# Patient Record
Sex: Female | Born: 1975 | Marital: Married | Smoking: Never smoker
Health system: Southern US, Community
[De-identification: ages and names within clinical notes are randomized; demographics above are authoritative.]

## PROBLEM LIST (undated history)

## (undated) DIAGNOSIS — M069 Rheumatoid arthritis, unspecified: Secondary | ICD-10-CM

## (undated) HISTORY — DX: Rheumatoid arthritis, unspecified: M06.9

## (undated) HISTORY — PX: TOTAL SHOULDER ARTHROPLASTY: SHX126

---

## 2013-10-28 ENCOUNTER — Ambulatory Visit
Admission: RE | Admit: 2013-10-28 | Discharge: 2013-10-28 | Disposition: A | Discharge status: Home / Self Care | Payer: BC Managed Care – PPO | Source: Ambulatory Visit | Attending: Family Medicine | Admitting: Family Medicine

## 2013-10-28 ENCOUNTER — Other Ambulatory Visit: Payer: Self-pay | Admitting: Family Medicine

## 2013-10-28 DIAGNOSIS — R109 Unspecified abdominal pain: Secondary | ICD-10-CM

## 2013-10-28 MED ORDER — IOHEXOL 300 MG/ML  SOLN
30.0000 mL | Freq: Once | INTRAMUSCULAR | Status: AC | PRN
  Administered 2013-10-28: 30 mL via ORAL

## 2013-10-28 MED ORDER — IOHEXOL 300 MG/ML  SOLN
100.0000 mL | Freq: Once | INTRAMUSCULAR | Status: AC | PRN
  Administered 2013-10-28: 100 mL via INTRAVENOUS

## 2014-01-02 ENCOUNTER — Other Ambulatory Visit: Payer: Self-pay

## 2014-01-02 ENCOUNTER — Other Ambulatory Visit (HOSPITAL_COMMUNITY)
Admission: RE | Admit: 2014-01-02 | Discharge: 2014-01-02 | Disposition: A | Discharge status: Home / Self Care | Payer: BC Managed Care – PPO | Source: Ambulatory Visit | Attending: Family Medicine | Admitting: Family Medicine

## 2014-01-02 ENCOUNTER — Other Ambulatory Visit: Payer: Self-pay | Admitting: Family Medicine

## 2014-01-02 DIAGNOSIS — Z1231 Encounter for screening mammogram for malignant neoplasm of breast: Secondary | ICD-10-CM

## 2014-01-02 DIAGNOSIS — Z01411 Encounter for gynecological examination (general) (routine) with abnormal findings: Secondary | ICD-10-CM | POA: Insufficient documentation

## 2014-01-04 LAB — CYTOLOGY - PAP

## 2014-06-30 ENCOUNTER — Ambulatory Visit
Admission: RE | Admit: 2014-06-30 | Discharge: 2014-06-30 | Disposition: A | Discharge status: Home / Self Care | Payer: BC Managed Care – PPO | Source: Ambulatory Visit

## 2014-06-30 DIAGNOSIS — Z1231 Encounter for screening mammogram for malignant neoplasm of breast: Secondary | ICD-10-CM

## 2014-12-27 ENCOUNTER — Other Ambulatory Visit: Payer: Self-pay | Admitting: Family Medicine

## 2014-12-27 ENCOUNTER — Other Ambulatory Visit (HOSPITAL_COMMUNITY)
Admission: RE | Admit: 2014-12-27 | Discharge: 2014-12-27 | Disposition: A | Discharge status: Home / Self Care | Payer: BC Managed Care – PPO | Source: Ambulatory Visit | Attending: Family Medicine | Admitting: Family Medicine

## 2014-12-27 DIAGNOSIS — Z124 Encounter for screening for malignant neoplasm of cervix: Secondary | ICD-10-CM | POA: Diagnosis not present

## 2015-01-01 LAB — CYTOLOGY - PAP

## 2015-07-17 ENCOUNTER — Other Ambulatory Visit: Payer: Self-pay | Admitting: Family Medicine

## 2015-07-17 DIAGNOSIS — Z1231 Encounter for screening mammogram for malignant neoplasm of breast: Secondary | ICD-10-CM

## 2015-07-30 ENCOUNTER — Ambulatory Visit
Admission: RE | Admit: 2015-07-30 | Discharge: 2015-07-30 | Disposition: A | Discharge status: Home / Self Care | Payer: BC Managed Care – PPO | Source: Ambulatory Visit | Attending: Family Medicine | Admitting: Family Medicine

## 2015-07-30 DIAGNOSIS — Z1231 Encounter for screening mammogram for malignant neoplasm of breast: Secondary | ICD-10-CM

## 2016-01-04 ENCOUNTER — Other Ambulatory Visit: Payer: Self-pay | Admitting: Family Medicine

## 2016-01-04 ENCOUNTER — Other Ambulatory Visit (HOSPITAL_COMMUNITY)
Admission: RE | Admit: 2016-01-04 | Discharge: 2016-01-04 | Disposition: A | Discharge status: Home / Self Care | Payer: BC Managed Care – PPO | Source: Ambulatory Visit | Attending: Family Medicine | Admitting: Family Medicine

## 2016-01-04 DIAGNOSIS — Z124 Encounter for screening for malignant neoplasm of cervix: Secondary | ICD-10-CM | POA: Diagnosis present

## 2016-01-04 DIAGNOSIS — Z1151 Encounter for screening for human papillomavirus (HPV): Secondary | ICD-10-CM | POA: Diagnosis present

## 2016-01-07 LAB — CYTOLOGY - PAP
DIAGNOSIS: NEGATIVE
HPV: NOT DETECTED

## 2016-04-23 ENCOUNTER — Ambulatory Visit (INDEPENDENT_AMBULATORY_CARE_PROVIDER_SITE_OTHER): Payer: BC Managed Care – PPO | Admitting: Podiatry

## 2016-04-23 ENCOUNTER — Ambulatory Visit (INDEPENDENT_AMBULATORY_CARE_PROVIDER_SITE_OTHER): Payer: BC Managed Care – PPO

## 2016-04-23 ENCOUNTER — Encounter: Payer: Self-pay | Admitting: Podiatry

## 2016-04-23 VITALS — BP 107/74 | HR 73 | Resp 14 | Ht 60.0 in | Wt 128.0 lb

## 2016-04-23 DIAGNOSIS — G5791 Unspecified mononeuropathy of right lower limb: Secondary | ICD-10-CM

## 2016-04-23 DIAGNOSIS — M898X7 Other specified disorders of bone, ankle and foot: Secondary | ICD-10-CM

## 2016-04-23 DIAGNOSIS — D3610 Benign neoplasm of peripheral nerves and autonomic nervous system, unspecified: Secondary | ICD-10-CM

## 2016-04-23 DIAGNOSIS — G5761 Lesion of plantar nerve, right lower limb: Secondary | ICD-10-CM | POA: Diagnosis not present

## 2016-04-30 MED ORDER — BETAMETHASONE SOD PHOS & ACET 6 (3-3) MG/ML IJ SUSP: 3.0000 mg | Freq: Once | INTRAMUSCULAR | Status: DC

## 2016-04-30 NOTE — Progress Notes (Signed)
   Subjective:  41 year old female presents to the office today for evaluation of right foot pain between digits 3-4. Patient states that she saw Dr. Gershon Mussel over the past year and received multiple injections. She states that the pain has gotten worse. Injections did not help. Patient was frustrated that she did not receive a full explanation of her diagnosis and symptoms. Patient also complains of pain to the pad of the right big toe.    Objective/Physical Exam General: The patient is alert and oriented x3 in no acute distress.  Dermatology: Skin is warm, dry and supple bilateral lower extremities. Negative for open lesions or macerations.  Vascular: Palpable pedal pulses bilaterally. No edema or erythema noted. Capillary refill within normal limits.  Neurological: Epicritic and protective threshold grossly intact bilaterally.   Musculoskeletal Exam: Positive Biagio Borg sign noted to the right foot with compression between digits 3 and 4. There is a palpable click also noted. This elicits the patient's symptoms consistent with a Morton's neuroma.  Radiographic Exam:  Normal osseous mineralization. Joint spaces preserved. No fracture/dislocation/boney destruction.  There is a large prominent exostosis noted to the right great toe on radiographic exam.  Assessment: #1 symptomatic exostosis right great toe #2 Morton's neuroma right foot   Plan of Care:  #1 Patient was evaluated. #2 injection of 0.5 mL Celestone Soluspan injected into the Morton's neuroma right foot #3 today patient was scanned for custom molded orthotics #4 prescription for anti-inflammatory pain cream dispensed through [facility] #5 return to clinic in 4 weeks   Edrick Kins, DPM [facility]  Dr. Edrick Kins, [address]                                        [place], [place] [address]                Office [phone]  Fax [phone]

## 2016-05-07 ENCOUNTER — Other Ambulatory Visit: Payer: BC Managed Care – PPO

## 2016-05-21 ENCOUNTER — Ambulatory Visit: Payer: BC Managed Care – PPO | Admitting: Podiatry

## 2016-06-04 ENCOUNTER — Encounter: Payer: Self-pay | Admitting: Podiatry

## 2016-06-04 ENCOUNTER — Ambulatory Visit (INDEPENDENT_AMBULATORY_CARE_PROVIDER_SITE_OTHER): Payer: Self-pay | Admitting: Podiatry

## 2016-06-04 DIAGNOSIS — G5761 Lesion of plantar nerve, right lower limb: Secondary | ICD-10-CM

## 2016-06-05 NOTE — Progress Notes (Signed)
   HPI:  Patient presents today for follow-up treatment and evaluation of a Morton's neuroma to the right foot as well as exostosis of the right great toe. Patient states that the custom molded orthotics alleviate her pain entirely to the right foot however since she gets out of her orthotics she spends a significant pain and tenderness relating to the Morton's neuroma. Multiple injections in the past have not alleviated any symptoms the patient Patient denies significant irritation or symptoms relating to the right great toe exostosis.    Physical Exam: General: The patient is alert and oriented x3 in no acute distress.  Dermatology: Skin is warm, dry and supple bilateral lower extremities. Negative for open lesions or macerations.  Vascular: Palpable pedal pulses bilaterally. No edema or erythema noted. Capillary refill within normal limits.  Neurological: Epicritic and protective threshold grossly intact bilaterally.   Musculoskeletal Exam: Positive Biagio Borg sign noted to the right foot with compression between digits 3 and 4. There is a palpable click also noted. This elicits the patient's symptoms consistent with a Morton's neuroma.  Assessment: 1. Morton's neuroma right foot 2. Symptomatic exostosis right great toe   Plan of Care:  1. Patient was evaluated. 2. Today we discussed the possible surgical management of Morton's neuroma. Patient is going to consider possible surgical excision of the neuroma. Recommend Vionic sandals during the summer to support the arches. 3. Return to clinic when necessary or 1 week postop   Edrick Kins, DPM [facility]  Dr. Edrick Kins, [address]                                        [place], [place] [address]                Office [phone]  Fax [phone]

## 2016-06-09 MED ORDER — NONFORMULARY OR COMPOUNDED ITEM: 0 refills | Status: DC

## 2016-06-09 NOTE — Addendum Note (Signed)
Addended by: Tomi Bamberger on: 06/09/2016 04:55 PM   Modules accepted: Orders

## 2016-08-04 ENCOUNTER — Other Ambulatory Visit: Payer: Self-pay | Admitting: Family Medicine

## 2016-08-04 DIAGNOSIS — Z1231 Encounter for screening mammogram for malignant neoplasm of breast: Secondary | ICD-10-CM

## 2016-08-07 ENCOUNTER — Ambulatory Visit
Admission: RE | Admit: 2016-08-07 | Discharge: 2016-08-07 | Disposition: A | Discharge status: Home / Self Care | Payer: BC Managed Care – PPO | Source: Ambulatory Visit | Attending: Family Medicine | Admitting: Family Medicine

## 2016-08-07 DIAGNOSIS — Z1231 Encounter for screening mammogram for malignant neoplasm of breast: Secondary | ICD-10-CM

## 2016-10-15 ENCOUNTER — Ambulatory Visit (INDEPENDENT_AMBULATORY_CARE_PROVIDER_SITE_OTHER): Payer: BC Managed Care – PPO | Admitting: Podiatry

## 2016-10-15 ENCOUNTER — Encounter: Payer: Self-pay | Admitting: Podiatry

## 2016-10-15 DIAGNOSIS — M722 Plantar fascial fibromatosis: Secondary | ICD-10-CM

## 2016-10-15 DIAGNOSIS — M7751 Other enthesopathy of right foot: Secondary | ICD-10-CM

## 2016-10-15 MED ORDER — BETAMETHASONE SOD PHOS & ACET 6 (3-3) MG/ML IJ SUSP: 3.0000 mg | Freq: Once | INTRAMUSCULAR | Status: DC

## 2016-10-15 MED ORDER — IBUPROFEN-FAMOTIDINE 800-26.6 MG PO TABS: 1.0000 | ORAL_TABLET | Freq: Three times a day (TID) | ORAL | 1 refills | Status: DC

## 2016-10-17 NOTE — Progress Notes (Signed)
   Subjective: Patient presents today for follow up evaluation of pain and tenderness in the feet bilaterally. She states the pain has returned and is the same as it was prior to her previous visit. Patient presents today for further treatment and evaluation.  No past medical history on file.  Objective: Physical Exam General: The patient is alert and oriented x3 in no acute distress.  Dermatology: Skin is warm, dry and supple bilateral lower extremities. Negative for open lesions or macerations bilateral.   Vascular: Dorsalis Pedis and Posterior Tibial pulses palpable bilateral.  Capillary fill time is immediate to all digits.  Neurological: Epicritic and protective threshold intact bilateral.   Musculoskeletal: Tenderness to palpation at the medial calcaneal tubercale and through the insertion of the plantar fascia of the bilateral feet. Pain on palpation to the right 5th MPJ. All other joints range of motion within normal limits bilateral. Strength 5/5 in all groups bilateral.    Assessment: 1. plantar fasciitis bilateral feet 2. Capsulitis right 5th MPJ  Plan of Care:  1. Patient evaluated.   2. Injection of 0.5cc Celestone soluspan injected into the right 5th MPJ.  3. Rx for Medrol Dose Pak given to patient (handwritten rx) 4. Rx for Duexis given to patient.  5. Return to clinic when necessary.    Edrick Kins, DPM [facility]  Dr. Edrick Kins, DPM    [address], [place] [address]                Office [phone]  Fax [phone]

## 2016-12-24 ENCOUNTER — Other Ambulatory Visit: Payer: Self-pay | Admitting: Gastroenterology

## 2016-12-24 DIAGNOSIS — R1011 Right upper quadrant pain: Secondary | ICD-10-CM

## 2016-12-24 DIAGNOSIS — R1013 Epigastric pain: Secondary | ICD-10-CM

## 2017-01-02 ENCOUNTER — Ambulatory Visit
Admission: RE | Admit: 2017-01-02 | Discharge: 2017-01-02 | Disposition: A | Discharge status: Home / Self Care | Payer: BC Managed Care – PPO | Source: Ambulatory Visit | Attending: Gastroenterology | Admitting: Gastroenterology

## 2017-01-02 DIAGNOSIS — R1011 Right upper quadrant pain: Secondary | ICD-10-CM

## 2017-01-02 DIAGNOSIS — R1013 Epigastric pain: Secondary | ICD-10-CM

## 2017-04-14 ENCOUNTER — Other Ambulatory Visit: Payer: Self-pay | Admitting: Family Medicine

## 2017-04-14 ENCOUNTER — Other Ambulatory Visit (HOSPITAL_COMMUNITY)
Admission: RE | Admit: 2017-04-14 | Discharge: 2017-04-14 | Disposition: A | Discharge status: Home / Self Care | Payer: BC Managed Care – PPO | Source: Ambulatory Visit | Attending: Family Medicine | Admitting: Family Medicine

## 2017-04-14 DIAGNOSIS — Z124 Encounter for screening for malignant neoplasm of cervix: Secondary | ICD-10-CM | POA: Diagnosis not present

## 2017-04-17 LAB — CYTOLOGY - PAP: Diagnosis: NEGATIVE

## 2017-07-24 ENCOUNTER — Other Ambulatory Visit: Payer: Self-pay | Admitting: Family Medicine

## 2017-07-24 DIAGNOSIS — Z1231 Encounter for screening mammogram for malignant neoplasm of breast: Secondary | ICD-10-CM

## 2017-08-12 ENCOUNTER — Other Ambulatory Visit (HOSPITAL_COMMUNITY): Payer: Self-pay | Admitting: Gastroenterology

## 2017-08-12 DIAGNOSIS — R1011 Right upper quadrant pain: Secondary | ICD-10-CM

## 2017-08-13 ENCOUNTER — Encounter (HOSPITAL_COMMUNITY)
Admission: RE | Admit: 2017-08-13 | Discharge: 2017-08-13 | Disposition: A | Discharge status: Home / Self Care | Payer: BC Managed Care – PPO | Source: Ambulatory Visit | Attending: Gastroenterology | Admitting: Gastroenterology

## 2017-08-13 DIAGNOSIS — R1011 Right upper quadrant pain: Secondary | ICD-10-CM | POA: Insufficient documentation

## 2017-08-13 MED ORDER — TECHNETIUM TC 99M MEBROFENIN IV KIT
5.3900 | PACK | Freq: Once | INTRAVENOUS | Status: AC | PRN
  Administered 2017-08-13: 5.39 via INTRAVENOUS

## 2017-08-20 ENCOUNTER — Ambulatory Visit: Payer: BC Managed Care – PPO

## 2017-09-14 ENCOUNTER — Ambulatory Visit
Admission: RE | Admit: 2017-09-14 | Discharge: 2017-09-14 | Disposition: A | Discharge status: Home / Self Care | Payer: BC Managed Care – PPO | Source: Ambulatory Visit | Attending: Family Medicine | Admitting: Family Medicine

## 2017-09-14 DIAGNOSIS — Z1231 Encounter for screening mammogram for malignant neoplasm of breast: Secondary | ICD-10-CM

## 2018-05-20 ENCOUNTER — Ambulatory Visit: Payer: BC Managed Care – PPO | Admitting: Rheumatology

## 2018-06-15 NOTE — Progress Notes (Signed)
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   History of Present Illness: Kristi Aguirre is a 43 y.o. female seen in consultation per request of Dr. Drema Dallas.  According to patient in 2017 she started having left shoulder joint pain at that time she was working out on regular basis.  She was seen by Dr. Tamera Punt and had surgery on her left shoulder joint.  She states she had no relief from that surgery.  In May 2019 she started experiencing increased joint pain and swelling.  She states she was seen by Dr. Drema Dallas who did lab work which was positive for rheumatoid factor.  She was referred to [facility] rheumatology where she was evaluated in July 2019.  She states she was placed on Plaquenil and meloxicam.  She did not notice much improvement and continued to have joint swelling.  They applied for Enbrel but it was declined by patient assistance and the co-pay was too high.  She did not want to take methotrexate due to hair loss.  She was already experiencing hair loss from Plaquenil.  She states she continues to have pain and discomfort in multiple joints.  She has swelling in her bilateral hands and discomfort.  She also has discomfort in her wrist joints left shoulder and right foot.  She continues to have some neck is stiffness.  She has been also experiencing GI discomfort after eating.  She has intermittent diarrhea.  She has history of IBS.  Activities of Daily Living:  Patient reports morning stiffness for 2 hours.   Patient Reports nocturnal pain.  Difficulty dressing/grooming: Reports Difficulty climbing stairs: Denies Difficulty getting out of chair: Denies Difficulty using hands for taps, buttons, cutlery, and/or writing: Denies  Review of Systems  Constitutional:  Positive for fatigue. Negative for night sweats, weight gain and weight loss.  HENT: Negative for mouth sores, trouble swallowing, trouble swallowing, mouth dryness and nose dryness.   Eyes: Negative for pain, redness, visual disturbance and dryness.  Respiratory: Negative for cough, shortness of breath and difficulty breathing.   Cardiovascular: Positive for swelling in legs/feet. Negative for chest pain, palpitations, hypertension and irregular heartbeat.  Gastrointestinal: Positive for diarrhea. Negative for blood in stool and constipation.  Endocrine: Positive for cold intolerance. Negative for increased urination.  Genitourinary: Negative for difficulty urinating and vaginal dryness.  Musculoskeletal: Positive for arthralgias, joint pain, joint swelling and morning stiffness. Negative for myalgias, muscle weakness, muscle tenderness and myalgias.  Skin: Positive for hair loss. Negative for color change, rash, skin tightness, ulcers and sensitivity to sunlight.  Allergic/Immunologic: Negative for susceptible to infections.  Neurological: Negative for dizziness, numbness, memory loss, night sweats and weakness.  Hematological: Negative for bruising/bleeding tendency and swollen glands.  Psychiatric/Behavioral: Positive for sleep disturbance. Negative for depressed mood. The patient is not nervous/anxious.     PMFS History:  There are no active problems to display for this patient.   History reviewed. No pertinent past medical history.  Family History  Problem Relation Age of Onset  . Thyroid disease Sister    Past Surgical History:  Procedure Laterality Date  . TOTAL SHOULDER ARTHROPLASTY     Social  History   Social History Narrative  . Not on file    There is no immunization history on file for this patient.   Objective: Vital Signs: BP 104/67 (BP Location: Right Arm, Patient Position: Sitting, Cuff Size: Normal)   Pulse 66   Resp 14   Ht 5' 1.75" (1.568 m)   Wt 138 lb  12.8 oz (63 kg)   LMP 06/03/2018   BMI 25.59 kg/m    Physical Exam Vitals signs and nursing note reviewed.  Constitutional:      Appearance: She is well-developed.  HENT:     Head: Normocephalic and atraumatic.  Eyes:     Conjunctiva/sclera: Conjunctivae normal.  Neck:     Musculoskeletal: Normal range of motion.  Cardiovascular:     Rate and Rhythm: Normal rate and regular rhythm.     Heart sounds: Normal heart sounds.  Pulmonary:     Effort: Pulmonary effort is normal.     Breath sounds: Normal breath sounds.  Abdominal:     General: Bowel sounds are normal.     Palpations: Abdomen is soft.  Lymphadenopathy:     Cervical: No cervical adenopathy.  Skin:    General: Skin is warm and dry.     Capillary Refill: Capillary refill takes less than 2 seconds.  Neurological:     Mental Status: She is alert and oriented to person, place, and time.  Psychiatric:        Behavior: Behavior normal.      Musculoskeletal Exam: C-spine some stiffness with range of motion.  Thoracic and lumbar spine good range of motion.  Shoulder joints were in good range of motion she had discomfort range of motion of the left shoulder joint.  Elbows and wrist joints with good range of motion.  She had discomfort range of motion of her left wrist joint.  She has synovitis in several of her MCPs and PIPs as described above.  Hip joints and knee joints are in good range of motion.  She has tenderness across her left foot MTPs.  CDAI Exam: CDAI Score: 13.6  Patient Global Assessment: 8 (mm); Provider Global Assessment: 8 (mm) Swollen: 5 ; Tender: 11  Joint Exam      Right  Left  Glenohumeral      Tender  Wrist      Tender  MCP 1     Swollen Tender  MCP 2     Swollen Tender  IP     Swollen Tender  PIP 2  Swollen Tender  Swollen Tender  MTP 2   Tender     MTP 3   Tender     MTP 4   Tender     MTP 5   Tender        Investigation: No additional findings.  Imaging: Xr Foot 2 Views Left   Result Date: 06/17/2018 Juxta-articular osteopenia was noted.  No erosive changes were noted.  No intertarsal or tibiotalar joint space narrowing was noted.  No MTP PIP or DIP narrowing was noted. Impression: These findings are consistent with rheumatoid arthritis.  Xr Foot 2 Views Right  Result Date: 06/17/2018 Juxta-articular osteopenia was noted.  No second MTP narrowing was noted.  Possible erosions were noted in the fifth MTP joint.  PIP and DIP narrowing was noted.  No intertarsal and tibiotalar joint space narrowing was noted. 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  Xr Hand 2 View Left  Result Date: 06/17/2018 Juxta-articular osteopenia was  noted.  Minimal PIP narrowing was noted.  No significant MCP narrowing was noted.  No intercarpal joint space narrowing was noted.  No radiocarpal joint space narrowing was noted. Impression: These findings are consistent with rheumatoid arthritis.  Xr Hand 2 View Right  Result Date: 06/17/2018 Juxta-articular osteopenia was noted.  No MCP PIP or DIP narrowing was noted.  Soft tissue swelling was noted in the right second digit.  Some cystic versus erosive changes were noted in the carpal bones.  No intercarpal radiocarpal joint space narrowing was noted. Impression: These findings are consistent with rheumatoid arthritis.   Recent Labs: No results found for: WBC, HGB, PLT, NA, K, CL, CO2, GLUCOSE, BUN, CREATININE, BILITOT, ALKPHOS, AST, ALT, PROT, ALBUMIN, CALCIUM, GFRAA, QFTBGOLD, QFTBGOLDPLUS  Speciality Comments: No specialty comments available.  Procedures:  No procedures performed Allergies: Patient has no known allergies.   Assessment / Plan:     Visit Diagnoses: Rheumatoid arthritis with rheumatoid factor of multiple sites without organ or systems involvement ([place]) - Diagnosed at [place], pt wanted to transfer careRF 94.2, CCP>250, Ro+, ANA positive  -has been on Plaquenil almost for a year  now.  She is been experiencing some hair loss on Plaquenil.  She still has active disease with synovitis.  The foot x-ray showed erosive changes.  Patient states that they applied for Enbrel in the past and it could not be approved by patient assistance.  The co-pay was too high.  I detailed discussion regarding different treatment options and their side effects.  She does not want to go on methotrexate due to concern of hair loss.  She is willing to try leflunomide.  Indications side effects contraindications were discussed and a consent was taken.  We will obtain labs today.  Once labs are available we will start her on leflunomide 10 mg p.o. daily for 2 weeks and check labs.  Once the labs are normal we will increase leflunomide to 20 mg p.o. daily and check labs in 2 weeks and then every 2 months.  Plan: Sedimentation rate  Medication counseling:  Contraception:vasectomy Patient was counseled on the purpose, proper use, and adverse effects of leflunomide including risk of infection, nausea/diarrhea/weight loss, increase in blood pressure, rash, hair loss, tingling in the hands and feet, and signs and symptoms of interstitial lung disease.   Also counseled on Black Box warning of liver injury and importance of avoiding alcohol while on therapy. Discussed that there is the possibility of an increased risk of malignancy but it is not well understood if this increased risk is due to the medication or the disease state.  Counseled patient to avoid live vaccines. Recommend annual influenza, Pneumovax 23, Prevnar 13, and Shingrix as indicated.   Discussed the importance of frequent monitoring of liver function and blood count.  Standing orders placed.  Discussed importance of birth control while on leflunomide due to risk of congenital abnormalities, and patient confirms that her husband had vasectomy.  Provided patient with educational materials on leflunomide and answered all questions.  Patient consented to  [place] use, and consent will be uploaded into the media tab.   Patient dose will be labs are available..  Prescription pending lab results and/or insurance approval.   High risk medication use - PLQ 200 mg 1 tablet by mouth BID,Meloxicam - Plan: CBC with Differential/Platelet, COMPLETE METABOLIC PANEL WITH GFR, Hepatitis B core antibody, IgM, Hepatitis B surface antigen, Hepatitis C antibody, HIV Antibody (routine testing w rflx), QuantiFERON-TB Gold Plus, Serum protein  electrophoresis with reflex, IgG, IgA, IgM, DG Chest 2 View  Pain in both hands -she has synovitis in MCPs and PIPs as described above.  Plan: XR Hand 2 View Right, XR Hand 2 View Left.  The hand x-rays were unremarkable except for soft tissue swelling and juxta-articular osteopenia.  Pain in both feet -she had tenderness across MTPs especially in the  right foot.  Plan: XR Foot 2 Views Right, XR Foot 2 Views Left.  Juxta-articular osteopenia was noted.  Erosive changes were noted in the right fifth MTP joint.  Positive ANA (antinuclear antibody) - Plan: Urinalysis, Routine w reflex microscopic, ANA, Anti-scleroderma antibody, RNP Antibody, Anti-Smith antibody, Sjogrens syndrome-A extractable nuclear antibody, Sjogrens syndrome-B extractable nuclear antibody, Anti-DNA antibody, double-stranded, C3 and C4, Beta-2 glycoprotein antibodies, Cardiolipin antibodies, IgG, IgM, IgA, Lupus Anticoagulant Eval w/Reflex  Other fatigue - Plan: CK  Hair loss-patient is concerned about hair loss.  She believes the hair loss started after taking Plaquenil.  Use of Rogaine and biotin over-the-counter was discussed.  Other medical problems are listed as follows:  History of hypothyroidism  Prediabetes  Hypercholesterolemia  History of rheumatic fever  History of IBS  Moderate mitral regurgitation   Orders: Orders Placed This Encounter  Procedures  . XR Hand 2 View Right  . XR Hand 2 View Left  . XR Foot 2 Views Right  . XR Foot 2  Views Left  . DG Chest 2 View  . CBC with Differential/Platelet  . COMPLETE METABOLIC PANEL WITH GFR  . Urinalysis, Routine w reflex microscopic  . CK  . Sedimentation rate  . Hepatitis B core antibody, IgM  . Hepatitis B surface antigen  . Hepatitis C antibody  . HIV Antibody (routine testing w rflx)  . QuantiFERON-TB Gold Plus  . Serum protein electrophoresis with reflex  . IgG, IgA, IgM  . ANA  . Anti-scleroderma antibody  . RNP Antibody  . Anti-Smith antibody  . Sjogrens syndrome-A extractable nuclear antibody  . Sjogrens syndrome-B extractable nuclear antibody  . Anti-DNA antibody, double-stranded  . C3 and C4  . Beta-2 glycoprotein antibodies  . Cardiolipin antibodies, IgG, IgM, IgA  . Lupus Anticoagulant Eval w/Reflex   No orders of the defined types were placed in this encounter.   Face-to-face time spent with patient was 60 minutes. Greater than 50% of time was spent in counseling and coordination of care.  Follow-Up Instructions: Return for Rheumatoid arthritis.   Bo Merino, MD  Note - This record has been created using Editor, commissioning.  Chart creation errors have been sought, but may not always  have been located. Such creation errors do not reflect on  the standard of medical care.

## 2018-06-17 ENCOUNTER — Encounter (INDEPENDENT_AMBULATORY_CARE_PROVIDER_SITE_OTHER): Payer: Self-pay

## 2018-06-17 ENCOUNTER — Ambulatory Visit: Payer: BC Managed Care – PPO | Admitting: Rheumatology

## 2018-06-17 ENCOUNTER — Ambulatory Visit: Payer: Self-pay

## 2018-06-17 ENCOUNTER — Other Ambulatory Visit: Payer: Self-pay

## 2018-06-17 ENCOUNTER — Encounter: Payer: Self-pay | Admitting: Rheumatology

## 2018-06-17 ENCOUNTER — Ambulatory Visit (INDEPENDENT_AMBULATORY_CARE_PROVIDER_SITE_OTHER): Payer: BC Managed Care – PPO

## 2018-06-17 VITALS — BP 104/67 | HR 66 | Resp 14 | Ht 61.75 in | Wt 138.8 lb

## 2018-06-17 DIAGNOSIS — M79672 Pain in left foot: Secondary | ICD-10-CM

## 2018-06-17 DIAGNOSIS — M0579 Rheumatoid arthritis with rheumatoid factor of multiple sites without organ or systems involvement: Secondary | ICD-10-CM | POA: Diagnosis not present

## 2018-06-17 DIAGNOSIS — M79641 Pain in right hand: Secondary | ICD-10-CM

## 2018-06-17 DIAGNOSIS — Z8639 Personal history of other endocrine, nutritional and metabolic disease: Secondary | ICD-10-CM

## 2018-06-17 DIAGNOSIS — Z79899 Other long term (current) drug therapy: Secondary | ICD-10-CM

## 2018-06-17 DIAGNOSIS — R768 Other specified abnormal immunological findings in serum: Secondary | ICD-10-CM

## 2018-06-17 DIAGNOSIS — Z8719 Personal history of other diseases of the digestive system: Secondary | ICD-10-CM

## 2018-06-17 DIAGNOSIS — M79671 Pain in right foot: Secondary | ICD-10-CM

## 2018-06-17 DIAGNOSIS — M79642 Pain in left hand: Secondary | ICD-10-CM

## 2018-06-17 DIAGNOSIS — R7303 Prediabetes: Secondary | ICD-10-CM

## 2018-06-17 DIAGNOSIS — I340 Nonrheumatic mitral (valve) insufficiency: Secondary | ICD-10-CM

## 2018-06-17 DIAGNOSIS — Z8679 Personal history of other diseases of the circulatory system: Secondary | ICD-10-CM

## 2018-06-17 DIAGNOSIS — L659 Nonscarring hair loss, unspecified: Secondary | ICD-10-CM

## 2018-06-17 DIAGNOSIS — R5383 Other fatigue: Secondary | ICD-10-CM

## 2018-06-17 DIAGNOSIS — E7800 Pure hypercholesterolemia, unspecified: Secondary | ICD-10-CM

## 2018-06-17 NOTE — Patient Instructions (Signed)
Leflunomide tablets What is this medicine? LEFLUNOMIDE (le FLOO na mide) is for rheumatoid arthritis. This medicine may be used for other purposes; ask your health care provider or pharmacist if you have questions. COMMON BRAND NAME(S): Arava What should I tell my health care provider before I take this medicine? They need to know if you have any of these conditions: -diabetes -have a fever or infection -high blood pressure -immune system problems -kidney disease -liver disease -low blood cell counts, like low white cell, platelet, or red cell counts -lung or breathing disease, like asthma -recently received or scheduled to receive a vaccine -receiving treatment for cancer -skin conditions or sensitivity -tingling of the fingers or toes, or other nerve disorder -tuberculosis -an unusual or allergic reaction to leflunomide, teriflunomide, other medicines, food, dyes, or preservatives -pregnant or trying to get pregnant -breast-feeding How should I use this medicine? Take this medicine by mouth with a full glass of water. Follow the directions on the prescription label. Take your medicine at regular intervals. Do not take your medicine more often than directed. Do not stop taking except on your doctor's advice. Talk to your pediatrician regarding the use of this medicine in children. Special care may be needed. Overdosage: If you think you have taken too much of this medicine contact a poison control center or emergency room at once. NOTE: This medicine is only for you. Do not share this medicine with others. What if I miss a dose? If you miss a dose, take it as soon as you can. If it is almost time for your next dose, take only that dose. Do not take double or extra doses. What may interact with this medicine? Do not take this medicine with any of the following medications: -teriflunomide This medicine may also interact with the following medications: -alosetron -birth control  pills -caffeine -cefaclor -certain medicines for diabetes like nateglinide, repaglinide, rosiglitazone, pioglitazone -certain medicines for high cholesterol like atorvastatin, pravastatin, rosuvastatin, simvastatin -charcoal -cholestyramine -ciprofloxacin -duloxetine -furosemide -ketoprofen -live virus vaccines -medicines that increase your risk for infection -methotrexate -mitoxantrone -paclitaxel -penicillin -theophylline -tizanidine -warfarin This list may not describe all possible interactions. Give your health care provider a list of all the medicines, herbs, non-prescription drugs, or dietary supplements you use. Also tell them if you smoke, drink alcohol, or use illegal drugs. Some items may interact with your medicine. What should I watch for while using this medicine? Visit your healthcare professional for regular checks on your progress. Tell your doctor or healthcare professional if your symptoms do not start to get better or if they get worse. You may need blood work done while you are taking this medicine. This medicine may stay in your body for up to 2 years after your last dose. Tell your doctor about any unusual side effects or symptoms. A medicine can be given to help lower your blood levels of this medicine more quickly. Women must use effective birth control with this medicine. There is a potential for serious side effects to an unborn child. Do not become pregnant while taking this medicine. Inform your doctor if you wish to become pregnant. This medicine remains in your blood after you stop taking it. You must continue using effective birth control until the blood levels have been checked and they are low enough. A medicine can be given to help lower your blood levels of this medicine more quickly. Immediately talk to your doctor if you think you may be pregnant. You may need a pregnancy  test. Talk to your health care professional or pharmacist for more information. You  should not receive certain vaccines during your treatment and for a certain time after your treatment with this medication ends. Talk to your health care professional for more information. What side effects may I notice from receiving this medicine? Side effects that you should report to your doctor or health care professional as soon as possible: -allergic reactions like skin rash, itching or hives, swelling of the face, lips, or tongue -breathing problems -cough -increased blood pressure -low blood counts - this medicine may decrease the number of white blood cells and platelets. You may be at increased risk for infections and bleeding. -pain, tingling, numbness in the hands or feet -redness, blistering, peeing or loosening of the skin, including inside the mouth -signs of decreased platelets or bleeding - bruising, pinpoint red spots on the skin, black, tarry stools, blood in urine -signs of infection - fever or chills, cough, sore throat, pain or trouble passing urine -signs and symptoms of liver injury like dark yellow or brown urine; general ill feeling or flu-like symptoms; light-colored stools; loss of appetite; nausea; right upper belly pain; unusually weak or tired; yellowing of the eyes or skin -trouble passing urine or change in the amount of urine -vomiting Side effects that usually do not require medical attention (report to your doctor or health care professional if they continue or are bothersome): -diarrhea -hair thinning or loss -headache -nausea -tiredness This list may not describe all possible side effects. Call your doctor for medical advice about side effects. You may report side effects to FDA at 1-800-FDA-1088. Where should I keep my medicine? Keep out of the reach of children. Store at room temperature between 15 and 30 degrees C (59 and 86 degrees F). Protect from moisture and light. Throw away any unused medicine after the expiration date. NOTE: This sheet is a  summary. It may not cover all possible information. If you have questions about this medicine, talk to your doctor, pharmacist, or health care provider.  2019 Elsevier/Gold Standard (2016-03-18 13:49:00)

## 2018-06-18 ENCOUNTER — Ambulatory Visit (HOSPITAL_BASED_OUTPATIENT_CLINIC_OR_DEPARTMENT_OTHER)
Admission: RE | Admit: 2018-06-18 | Discharge: 2018-06-18 | Disposition: A | Discharge status: Home / Self Care | Payer: BC Managed Care – PPO | Source: Ambulatory Visit | Attending: Rheumatology | Admitting: Rheumatology

## 2018-06-18 ENCOUNTER — Telehealth: Payer: Self-pay | Admitting: *Deleted

## 2018-06-18 DIAGNOSIS — Z79899 Other long term (current) drug therapy: Secondary | ICD-10-CM | POA: Diagnosis not present

## 2018-06-18 NOTE — Telephone Encounter (Signed)
Per Dr. Estanislado Pandy   Once labs are available we will start her on leflunomide 10 mg p.o. daily for 2 weeks and check labs.  Once the labs are normal we will increase leflunomide to 20 mg p.o. daily and check labs in 2 weeks and then every 2 months

## 2018-06-21 ENCOUNTER — Telehealth: Payer: Self-pay | Admitting: Infectious Disease

## 2018-06-21 NOTE — Progress Notes (Signed)
CXR normal.

## 2018-06-21 NOTE — Telephone Encounter (Signed)
Thank you for your response. I was wondering what to do next. She is a new patient to me. I will check with the lab if they are performing an HIV RNA quant.

## 2018-06-21 NOTE — Telephone Encounter (Signed)
Thank yoU !!! 

## 2018-06-21 NOTE — Telephone Encounter (Signed)
Dr. Gypsy Lore I noticed that the patients HIV screening test for HIV+ with negative HIV1 and 2 antibodies this is either a false + EIA or acute  HIV.   There should be an HIV RNA qual being performed by algorithm to let us know the truth  If this does not happen I would recommend getting an HIV quant RNA  We are happy to bring her to clinic to [place] if she actually ahs HIV  Does she also have sig risk for hIV. If so PREP would be something we could also offer her

## 2018-06-23 ENCOUNTER — Ambulatory Visit: Payer: BC Managed Care – PPO | Admitting: Rheumatology

## 2018-06-23 MED ORDER — HYDROXYCHLOROQUINE SULFATE 200 MG PO TABS: 200.0000 mg | ORAL_TABLET | Freq: Two times a day (BID) | ORAL | 0 refills | Status: DC

## 2018-06-23 NOTE — Telephone Encounter (Signed)
Okay to refill Plaquenil

## 2018-06-23 NOTE — Telephone Encounter (Signed)
Patient contacted the office and states she has stopped the Meloxicam as it was causing pain in her stomach. Patient asked if we were going to send in the prescription for the [place]. Advised patient we are awaiting her labs results before we can send in the prescription. Patient states she is in need a a refill on her PLQ. Can we send in a prescription for PLQ to the pharmacy for her?

## 2018-06-23 NOTE — Addendum Note (Signed)
Addended by: Carole Binning on: 06/23/2018 03:33 PM   Modules accepted: Orders

## 2018-06-23 NOTE — Telephone Encounter (Signed)
Prescription for PLQ has been sent to the pharmacy.

## 2018-06-27 LAB — COMPLETE METABOLIC PANEL WITH GFR
AG Ratio: 1.3 (calc) (ref 1.0–2.5)
ALT: 14 U/L (ref 6–29)
AST: 19 U/L (ref 10–30)
Albumin: 4.4 g/dL (ref 3.6–5.1)
Alkaline phosphatase (APISO): 34 U/L (ref 31–125)
BUN: 11 mg/dL (ref 7–25)
CO2: 28 mmol/L (ref 20–32)
Calcium: 9.8 mg/dL (ref 8.6–10.2)
Chloride: 100 mmol/L (ref 98–110)
Creat: 0.77 mg/dL (ref 0.50–1.10)
GFR, Est African American: 109 mL/min/{1.73_m2} (ref >=60)
GFR, Est Non African American: 94 mL/min/{1.73_m2} (ref >=60)
Globulin: 3.5 g/dL (calc) (ref 1.9–3.7)
Glucose, Bld: 88 mg/dL (ref 65–139)
Potassium: 4.4 mmol/L (ref 3.5–5.3)
Sodium: 136 mmol/L (ref 135–146)
Total Bilirubin: 0.7 mg/dL (ref 0.2–1.2)
Total Protein: 7.9 g/dL (ref 6.1–8.1)

## 2018-06-27 LAB — URINALYSIS, ROUTINE W REFLEX MICROSCOPIC
Bilirubin Urine: NEGATIVE
Glucose, UA: NEGATIVE
Hgb urine dipstick: NEGATIVE
Ketones, ur: NEGATIVE
Leukocytes,Ua: NEGATIVE
Nitrite: NEGATIVE
Protein, ur: NEGATIVE
Specific Gravity, Urine: 1.005 (ref 1.001–1.03)
pH: 7 (ref 5.0–8.0)

## 2018-06-27 LAB — CBC WITH DIFFERENTIAL/PLATELET
Absolute Monocytes: 521 cells/uL (ref 200–950)
Basophils Absolute: 37 cells/uL (ref 0–200)
Basophils Relative: 0.6 %
Eosinophils Absolute: 657 cells/uL — ABNORMAL HIGH (ref 15–500)
Eosinophils Relative: 10.6 %
HCT: 37.9 % (ref 35.0–45.0)
Hemoglobin: 12.9 g/dL (ref 11.7–15.5)
Lymphs Abs: 2195 cells/uL (ref 850–3900)
MCH: 29.5 pg (ref 27.0–33.0)
MCHC: 34 g/dL (ref 32.0–36.0)
MCV: 86.5 fL (ref 80.0–100.0)
MPV: 10.3 fL (ref 7.5–12.5)
Monocytes Relative: 8.4 %
Neutro Abs: 2790 cells/uL (ref 1500–7800)
Neutrophils Relative %: 45 %
Platelets: 282 10*3/uL (ref 140–400)
RBC: 4.38 10*6/uL (ref 3.80–5.10)
RDW: 12.5 % (ref 11.0–15.0)
Total Lymphocyte: 35.4 %
WBC: 6.2 10*3/uL (ref 3.8–10.8)

## 2018-06-27 LAB — HIV-1/2 AB - DIFFERENTIATION
HIV-1 antibody: NEGATIVE
HIV-2 Ab: NEGATIVE

## 2018-06-27 LAB — BETA-2 GLYCOPROTEIN ANTIBODIES
Beta-2 Glyco 1 IgA: <9 SAU (ref <=20)
Beta-2 Glyco 1 IgM: <9 SMU (ref <=20)
Beta-2 Glyco I IgG: <9 SGU (ref <=20)

## 2018-06-27 LAB — QUANTIFERON-TB GOLD PLUS
Mitogen-NIL: 8.31 IU/mL
NIL: 0.03 IU/mL
QuantiFERON-TB Gold Plus: NEGATIVE
TB1-NIL: 0.02 IU/mL
TB2-NIL: 0.05 IU/mL

## 2018-06-27 LAB — CARDIOLIPIN ANTIBODIES, IGG, IGM, IGA
Anticardiolipin IgA: <11 [APL'U]
Anticardiolipin IgG: <14 [GPL'U]
Anticardiolipin IgM: <12 [MPL'U]

## 2018-06-27 LAB — SJOGRENS SYNDROME-B EXTRACTABLE NUCLEAR ANTIBODY: SSB (La) (ENA) Antibody, IgG: <1 AI

## 2018-06-27 LAB — C3 AND C4
C3 Complement: 160 mg/dL (ref 83–193)
C4 Complement: 44 mg/dL (ref 15–57)

## 2018-06-27 LAB — PROTEIN ELECTROPHORESIS, SERUM, WITH REFLEX
Albumin ELP: 4.2 g/dL (ref 3.8–4.8)
Alpha 1: 0.3 g/dL (ref 0.2–0.3)
Alpha 2: 0.8 g/dL (ref 0.5–0.9)
Beta 2: 0.5 g/dL (ref 0.2–0.5)
Beta Globulin: 0.5 g/dL (ref 0.4–0.6)
Gamma Globulin: 1.7 g/dL (ref 0.8–1.7)
Total Protein: 8 g/dL (ref 6.1–8.1)

## 2018-06-27 LAB — LUPUS ANTICOAGULANT EVAL W/ REFLEX
PTT-LA Screen: 38 s (ref <=40)
dRVVT: 31 s (ref <=45)

## 2018-06-27 LAB — SJOGRENS SYNDROME-A EXTRACTABLE NUCLEAR ANTIBODY: SSA (Ro) (ENA) Antibody, IgG: 3.4 AI — AB

## 2018-06-27 LAB — ANTI-SMITH ANTIBODY: ENA SM Ab Ser-aCnc: <1 AI

## 2018-06-27 LAB — ANTI-SCLERODERMA ANTIBODY: Scleroderma (Scl-70) (ENA) Antibody, IgG: <1 AI

## 2018-06-27 LAB — CK: Total CK: 111 U/L (ref 29–143)

## 2018-06-27 LAB — IGG, IGA, IGM
IgG (Immunoglobin G), Serum: 1658 mg/dL — ABNORMAL HIGH (ref 600–1640)
IgM, Serum: 132 mg/dL (ref 50–300)
Immunoglobulin A: 406 mg/dL — ABNORMAL HIGH (ref 47–310)

## 2018-06-27 LAB — RNP ANTIBODY: Ribonucleic Protein(ENA) Antibody, IgG: <1 AI

## 2018-06-27 LAB — SEDIMENTATION RATE: Sed Rate: 22 mm/h — ABNORMAL HIGH (ref 0–20)

## 2018-06-27 LAB — HIV ANTIBODY (ROUTINE TESTING W REFLEX): HIV 1&2 Ab, 4th Generation: REACTIVE — AB

## 2018-06-27 LAB — ANA: Anti Nuclear Antibody (ANA): NEGATIVE

## 2018-06-27 LAB — ANTI-DNA ANTIBODY, DOUBLE-STRANDED: ds DNA Ab: 5 IU/mL — ABNORMAL HIGH

## 2018-06-27 LAB — HEPATITIS B CORE ANTIBODY, IGM: Hep B C IgM: NONREACTIVE

## 2018-06-27 LAB — HEPATITIS C ANTIBODY
Hepatitis C Ab: NONREACTIVE
SIGNAL TO CUT-OFF: 0.05 (ref <1.00)

## 2018-06-27 LAB — HIV-1 RNA, QUALITATIVE, TMA: HIV-1 RNA, Qualitative, TMA: NOT DETECTED

## 2018-06-27 LAB — HEPATITIS B SURFACE ANTIGEN: Hepatitis B Surface Ag: NONREACTIVE

## 2018-06-29 DIAGNOSIS — R768 Other specified abnormal immunological findings in serum: Secondary | ICD-10-CM | POA: Insufficient documentation

## 2018-06-29 DIAGNOSIS — Z8639 Personal history of other endocrine, nutritional and metabolic disease: Secondary | ICD-10-CM | POA: Insufficient documentation

## 2018-06-29 DIAGNOSIS — R7303 Prediabetes: Secondary | ICD-10-CM | POA: Insufficient documentation

## 2018-06-29 DIAGNOSIS — E7800 Pure hypercholesterolemia, unspecified: Secondary | ICD-10-CM | POA: Insufficient documentation

## 2018-06-29 DIAGNOSIS — I340 Nonrheumatic mitral (valve) insufficiency: Secondary | ICD-10-CM | POA: Insufficient documentation

## 2018-06-29 DIAGNOSIS — Z8719 Personal history of other diseases of the digestive system: Secondary | ICD-10-CM | POA: Insufficient documentation

## 2018-06-29 DIAGNOSIS — Z8679 Personal history of other diseases of the circulatory system: Secondary | ICD-10-CM | POA: Insufficient documentation

## 2018-06-29 DIAGNOSIS — M0579 Rheumatoid arthritis with rheumatoid factor of multiple sites without organ or systems involvement: Secondary | ICD-10-CM | POA: Insufficient documentation

## 2018-06-29 NOTE — Progress Notes (Signed)
Office Visit Note  Patient: Kristi Aguirre             Date of Birth: 10/22/1975           MRN: [id number]             PCP: Leighton Ruff, MD Referring: Leighton Ruff, MD Visit Date: 07/08/2018 Occupation: _0 @  Subjective:  Pain in multiple joints.   History of Present Illness: Kristi Aguirre is a 43 y.o. female history of seropositive rheumatoid arthritis.  She states she continues to have pain and discomfort in multiple joints.  She states she her left shoulder, bilateral hands, bilateral knees, bilateral ankles and feet has been painful.  The swelling in her hands and feet.  She has been off meloxicam which has helped her GI symptoms.  She is currently on Plaquenil 200 mg twice daily only.  Activities of Daily Living:  Patient reports morning stiffness for several hours.   Patient Reports nocturnal pain.  Difficulty dressing/grooming: Denies Difficulty climbing stairs: Reports Difficulty getting out of chair: Reports Difficulty using hands for taps, buttons, cutlery, and/or writing: Reports  Review of Systems  Constitutional: Negative for fatigue, night sweats, weight gain and weight loss.  HENT: Negative for mouth sores, trouble swallowing, trouble swallowing, mouth dryness and nose dryness.   Eyes: Negative for pain, redness, itching, visual disturbance and dryness.  Respiratory: Negative for cough, shortness of breath, wheezing and difficulty breathing.   Cardiovascular: Negative for chest pain, palpitations, hypertension, irregular heartbeat and swelling in legs/feet.  Gastrointestinal: Negative for blood in stool, constipation and diarrhea.  Endocrine: Negative for increased urination.  Genitourinary: Negative for painful urination, pelvic pain and vaginal dryness.  Musculoskeletal: Positive for arthralgias, joint pain, joint swelling and morning stiffness. Negative for myalgias, muscle weakness, muscle tenderness and myalgias.  Skin: Negative for color  change, rash, hair loss, redness, skin tightness, ulcers and sensitivity to sunlight.  Allergic/Immunologic: Negative for susceptible to infections.  Neurological: Negative for dizziness, light-headedness, headaches, memory loss, night sweats and weakness.  Hematological: Negative for bruising/bleeding tendency and swollen glands.  Psychiatric/Behavioral: Negative for depressed mood, confusion and sleep disturbance. The patient is not nervous/anxious.     PMFS History:  Patient Active Problem List   Diagnosis Date Noted  . Rheumatoid arthritis with rheumatoid factor of multiple sites without organ or systems involvement ([place]) 06/29/2018  . Positive ANA (antinuclear antibody) 06/29/2018  . History of hypothyroidism 06/29/2018  . History of IBS 06/29/2018  . Hypercholesterolemia 06/29/2018  . Prediabetes 06/29/2018  . Moderate mitral regurgitation 06/29/2018  . History of rheumatic fever 06/29/2018    History reviewed. No pertinent past medical history.  Family History  Problem Relation Age of Onset  . Thyroid disease Sister    Past Surgical History:  Procedure Laterality Date  . TOTAL SHOULDER ARTHROPLASTY     Social History   Social History Narrative  . Not on file    There is no immunization history on file for this patient.   Objective: Vital Signs: BP 112/79 (BP Location: Right Arm, Patient Position: Sitting, Cuff Size: Normal)   Pulse 70   Resp 13   Ht _1  (1.549 m)   Wt 139 lb 6.4 oz (63.2 kg)   BMI 26.34 kg/m    Physical Exam Vitals signs and nursing note reviewed.  Constitutional:      Appearance: She is well-developed.  HENT:     Head: Normocephalic and atraumatic.  Eyes:     Conjunctiva/sclera: Conjunctivae normal.  Neck:     Musculoskeletal: Normal range of motion.  Cardiovascular:     Rate and Rhythm: Normal rate and regular rhythm.     Heart sounds: Normal heart sounds.  Pulmonary:     Effort: Pulmonary effort is normal.     Breath sounds:  Normal breath sounds.  Abdominal:     General: Bowel sounds are normal.     Palpations: Abdomen is soft.  Lymphadenopathy:     Cervical: No cervical adenopathy.  Skin:    General: Skin is warm and dry.     Capillary Refill: Capillary refill takes less than 2 seconds.  Neurological:     Mental Status: She is alert and oriented to person, place, and time.  Psychiatric:        Behavior: Behavior normal.      Musculoskeletal Exam: C-spine good range of motion.  She has limited painful range of motion of left shoulder joint with abduction up to 90 degrees.  Elbow joints with good range of motion.  Wrist joints in good range of motion.  She had swelling and synovitis over left second MCP joint.  She had warmth and swelling in her left knee joint.  Synovitis was noted in her MTPs as described below.  CDAI Exam: CDAI Score: 7.6  Patient Global Assessment: 8 (mm); Provider Global Assessment: 8 (mm) Swollen: 5 ; Tender: 9  Joint Exam      Right  Left  Glenohumeral      Tender  Wrist      Tender  MCP 2     Swollen Tender  Knee     Swollen Tender  MTP 2  Swollen Tender     MTP 3  Swollen Tender     MTP 4  Swollen Tender     MTP 5   Tender   Tender     Investigation: No additional findings.  Imaging: Dg Chest 2 View  Result Date: 06/21/2018 CLINICAL DATA:  Immunosuppressive therapy.  Rheumatoid arthritis. EXAM: CHEST - 2 VIEW COMPARISON:  None. FINDINGS: The heart size and mediastinal contours are normal. The lungs are clear. There is no pleural effusion or pneumothorax. No acute osseous findings are identified. High density is noted anteriorly in the mid abdomen, possibly ingested material in the stomach. IMPRESSION: No active cardiopulmonary process. Electronically Signed   By: Richardean Sale M.D.   On: 06/21/2018 08:56   Xr Foot 2 Views Left  Result Date: 06/17/2018 Juxta-articular osteopenia was noted.  No erosive changes were noted.  No intertarsal or tibiotalar joint space  narrowing was noted.  No MTP PIP or DIP narrowing was noted. Impression: These findings are consistent with rheumatoid arthritis.  Xr Foot 2 Views Right  Result Date: 06/17/2018 Juxta-articular osteopenia was noted.  No second MTP narrowing was noted.  Possible erosions were noted in the fifth MTP joint.  PIP and DIP narrowing was noted.  No intertarsal and tibiotalar joint space narrowing was noted. 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  Xr Hand 2 View Left  Result Date: 06/17/2018 Juxta-articular osteopenia was noted.  Minimal PIP narrowing was noted.  No significant MCP narrowing was noted.  No intercarpal joint space narrowing was noted.  No radiocarpal joint space narrowing was noted. Impression: These findings are consistent with rheumatoid arthritis.  Xr Hand 2 View Right  Result Date: 06/17/2018 Juxta-articular osteopenia was noted.  No MCP PIP or DIP narrowing was noted.  Soft tissue swelling was noted in the right  second digit.  Some cystic versus erosive changes were noted in the carpal bones.  No intercarpal radiocarpal joint space narrowing was noted. Impression: These findings are consistent with rheumatoid arthritis.   Recent Labs: Lab Results  Component Value Date   WBC 6.2 06/17/2018   HGB 12.9 06/17/2018   PLT 282 06/17/2018   NA 136 06/17/2018   K 4.4 06/17/2018   CL 100 06/17/2018   CO2 28 06/17/2018   GLUCOSE 88 06/17/2018   BUN 11 06/17/2018   CREATININE 0.77 06/17/2018   BILITOT 0.7 06/17/2018   AST 19 06/17/2018   ALT 14 06/17/2018   PROT 7.9 06/17/2018   PROT 8.0 06/17/2018   CALCIUM 9.8 06/17/2018   GFRAA 109 06/17/2018   QFTBGOLDPLUS NEGATIVE 06/17/2018  UA negative, CK normal, SPEP negative, immunoglobulins normal TB Gold negative, hepatitis B-, hepatitis C negative, HIV RNA quant negative, ANA negative, SSA positive, SSB negative, double-stranded DNA 5, (Smith, RNP, SCL 70-), C3-C4 normal, anticardiolipin  negative, beta-2 GP 1-, lupus anticoagulant negative ESR 22 Speciality Comments: No specialty comments available.  Procedures:  No procedures performed Allergies: Patient has no known allergies.   Assessment / Plan:     Visit Diagnoses: Rheumatoid arthritis with rheumatoid factor of multiple sites without organ or systems involvement (HCC) - RF 94.2, anti-CCP> 250, ANA negative, dsDNA positive, SSA positive.  Diagnosed by [facility] rheumatology.  Patient has severe rheumatoid arthritis with synovitis involving multiple joints.  She is currently on Plaquenil which is not controlling her symptoms well.  She did not want to start on methotrexate because of the risk of hair loss.  She is wants to proceed with [place].  Indication side effects contraindications were discussed at length.  Consent was taken.  She will be placed on Arava 20 mg p.o. daily.  We will check labs in 2 weeks and then every 2 months to monitor for drug toxicity.  If she has an adequate response response in 2 months I may consider adding a biologic agent.  She will continue Plaquenil along with Arava.  Due to severely of her arthritis I will also place her on prednisone 20 mg p.o. daily and taper by 5 mg every 4 days. High risk medication use - Inadequate response to Plaquenil 200 mg p.o. twice daily.  HIV RNA quant negative.  Positive ANA (antinuclear antibody) - Double-stranded DNA positive, Ro positive.  Patient has no features of lupus or Sjogren's.  Hair loss-she has had chronic hair loss.  I did made her aware that [place] sometimes can contribute to hair loss as well.  History of hypothyroidism  History of IBS  Hypercholesterolemia  Prediabetes  Moderate mitral regurgitation  History of rheumatic fever   Orders: Orders Placed This Encounter  Procedures  . CBC with Differential/Platelet  . COMPLETE METABOLIC PANEL WITH GFR   Meds ordered this encounter  Medications  . predniSONE (DELTASONE) 5 MG tablet    Sig:  Take 4 tablets (20 mg total) by mouth daily for 4 days, THEN 3 tablets (15 mg total) daily for 4 days, THEN 2 tablets (10 mg total) daily for 4 days, THEN 1 tablet (5 mg total) daily for 4 days, THEN 0.5 tablets (2.5 mg total) daily for 4 days.    Dispense:  42 tablet    Refill:  0  . leflunomide (ARAVA) 20 MG tablet    Sig: Take 1 tablet (20 mg total) by mouth daily.    Dispense:  30 tablet    Refill:  2    Face-to-face time spent with patient was 30 minutes. Greater than 50% of time was spent in counseling and coordination of care.  Follow-Up Instructions: Return in about 2 months (around 09/07/2018) for Rheumatoid arthritis.   Bo Merino, MD  Note - This record has been created using Editor, commissioning.  Chart creation errors have been sought, but may not always  have been located. Such creation errors do not reflect on  the standard of medical care.

## 2018-06-29 NOTE — Telephone Encounter (Signed)
Patient's HIV RNA quant came negative.  Please advise what should I discussed with patient.  Should I be concerned about monitoring her levels in the future as she will be on aggressive immunosuppressive therapy.  Do I need to refer this patient to you.

## 2018-06-30 NOTE — Telephone Encounter (Signed)
No problem at all!

## 2018-06-30 NOTE — Telephone Encounter (Signed)
Thank you :)

## 2018-06-30 NOTE — Telephone Encounter (Signed)
The quant came back negative we are done there is no need for further testing she just has a false positive Kristi Aguirre

## 2018-07-08 ENCOUNTER — Encounter: Payer: Self-pay | Admitting: Rheumatology

## 2018-07-08 ENCOUNTER — Ambulatory Visit: Payer: BC Managed Care – PPO | Admitting: Rheumatology

## 2018-07-08 ENCOUNTER — Other Ambulatory Visit: Payer: Self-pay

## 2018-07-08 VITALS — BP 112/79 | HR 70 | Resp 13 | Ht 61.0 in | Wt 139.4 lb

## 2018-07-08 DIAGNOSIS — M0579 Rheumatoid arthritis with rheumatoid factor of multiple sites without organ or systems involvement: Secondary | ICD-10-CM

## 2018-07-08 DIAGNOSIS — Z8639 Personal history of other endocrine, nutritional and metabolic disease: Secondary | ICD-10-CM

## 2018-07-08 DIAGNOSIS — L659 Nonscarring hair loss, unspecified: Secondary | ICD-10-CM | POA: Diagnosis not present

## 2018-07-08 DIAGNOSIS — Z8719 Personal history of other diseases of the digestive system: Secondary | ICD-10-CM

## 2018-07-08 DIAGNOSIS — R7303 Prediabetes: Secondary | ICD-10-CM

## 2018-07-08 DIAGNOSIS — R768 Other specified abnormal immunological findings in serum: Secondary | ICD-10-CM | POA: Diagnosis not present

## 2018-07-08 DIAGNOSIS — Z8679 Personal history of other diseases of the circulatory system: Secondary | ICD-10-CM

## 2018-07-08 DIAGNOSIS — Z79899 Other long term (current) drug therapy: Secondary | ICD-10-CM

## 2018-07-08 DIAGNOSIS — I340 Nonrheumatic mitral (valve) insufficiency: Secondary | ICD-10-CM

## 2018-07-08 DIAGNOSIS — E7800 Pure hypercholesterolemia, unspecified: Secondary | ICD-10-CM

## 2018-07-08 MED ORDER — PREDNISONE 5 MG PO TABS: ORAL_TABLET | ORAL | 0 refills | Status: AC

## 2018-07-08 MED ORDER — LEFLUNOMIDE 20 MG PO TABS: 20.0000 mg | ORAL_TABLET | Freq: Every day | ORAL | 2 refills | Status: DC

## 2018-07-08 NOTE — Patient Instructions (Signed)
Standing Labs We placed an order today for your standing lab work.    Please come back and get your standing labs in 2 weeks, 2 months, and the every 3 months.  We have open lab daily Monday through Thursday from 8:30-12:30 PM and 1:30-4:30 PM and Friday from 8:30-12:30 PM and 1:30 -4:00 PM at the office of Dr. Bo Merino.   You may experience shorter wait times on Monday and Friday afternoons. The office is located at [address] [place], [place], [place] [address] No appointment is necessary.   Labs are drawn by [facility].  You may receive a bill from [facility] for your lab work.  If you wish to have your labs drawn at another location, please call the office 24 hours in advance to send orders.  If you have any questions regarding directions or hours of operation,  please call [phone].   Just as a reminder please drink plenty of water prior to coming for your lab work. Thanks!  Vaccines You are taking a medication(s) that can suppress your immune system.  The following immunizations are recommended: . Flu annually . Pneumonia (Pneumovax 23 and Prevnar 13 spaced at least 1 year apart) . Shingrix  Please check with your PCP to make sure you are up to date.  Leflunomide tablets What is this medicine? LEFLUNOMIDE (le FLOO na mide) is for rheumatoid arthritis. This medicine may be used for other purposes; ask your health care provider or pharmacist if you have questions. COMMON BRAND NAME(S): Arava What should I tell my health care provider before I take this medicine? They need to know if you have any of these conditions: -diabetes -have a fever or infection -high blood pressure -immune system problems -kidney disease -liver disease -low blood cell counts, like low white cell, platelet, or red cell counts -lung or breathing disease, like asthma -recently received or scheduled to receive a vaccine -receiving treatment for cancer -skin conditions or  sensitivity -tingling of the fingers or toes, or other nerve disorder -tuberculosis -an unusual or allergic reaction to leflunomide, teriflunomide, other medicines, food, dyes, or preservatives -pregnant or trying to get pregnant -breast-feeding How should I use this medicine? Take this medicine by mouth with a full glass of water. Follow the directions on the prescription label. Take your medicine at regular intervals. Do not take your medicine more often than directed. Do not stop taking except on your doctor's advice. Talk to your pediatrician regarding the use of this medicine in children. Special care may be needed. Overdosage: If you think you have taken too much of this medicine contact a poison control center or emergency room at once. NOTE: This medicine is only for you. Do not share this medicine with others. What if I miss a dose? If you miss a dose, take it as soon as you can. If it is almost time for your next dose, take only that dose. Do not take double or extra doses. What may interact with this medicine? Do not take this medicine with any of the following medications: -teriflunomide This medicine may also interact with the following medications: -alosetron -birth control pills -caffeine -cefaclor -certain medicines for diabetes like nateglinide, repaglinide, rosiglitazone, pioglitazone -certain medicines for high cholesterol like atorvastatin, pravastatin, rosuvastatin, simvastatin -charcoal -cholestyramine -ciprofloxacin -duloxetine -furosemide -ketoprofen -live virus vaccines -medicines that increase your risk for infection -methotrexate -mitoxantrone -paclitaxel -penicillin -theophylline -tizanidine -warfarin This list may not describe all possible interactions. Give your health care provider a list of all  the medicines, herbs, non-prescription drugs, or dietary supplements you use. Also tell them if you smoke, drink alcohol, or use illegal drugs. Some items  may interact with your medicine. What should I watch for while using this medicine? Visit your healthcare professional for regular checks on your progress. Tell your doctor or healthcare professional if your symptoms do not start to get better or if they get worse. You may need blood work done while you are taking this medicine. This medicine may stay in your body for up to 2 years after your last dose. Tell your doctor about any unusual side effects or symptoms. A medicine can be given to help lower your blood levels of this medicine more quickly. Women must use effective birth control with this medicine. There is a potential for serious side effects to an unborn child. Do not become pregnant while taking this medicine. Inform your doctor if you wish to become pregnant. This medicine remains in your blood after you stop taking it. You must continue using effective birth control until the blood levels have been checked and they are low enough. A medicine can be given to help lower your blood levels of this medicine more quickly. Immediately talk to your doctor if you think you may be pregnant. You may need a pregnancy test. Talk to your health care professional or pharmacist for more information. You should not receive certain vaccines during your treatment and for a certain time after your treatment with this medication ends. Talk to your health care professional for more information. What side effects may I notice from receiving this medicine? Side effects that you should report to your doctor or health care professional as soon as possible: -allergic reactions like skin rash, itching or hives, swelling of the face, lips, or tongue -breathing problems -cough -increased blood pressure -low blood counts - this medicine may decrease the number of white blood cells and platelets. You may be at increased risk for infections and bleeding. -pain, tingling, numbness in the hands or feet -redness, blistering,  peeing or loosening of the skin, including inside the mouth -signs of decreased platelets or bleeding - bruising, pinpoint red spots on the skin, black, tarry stools, blood in urine -signs of infection - fever or chills, cough, sore throat, pain or trouble passing urine -signs and symptoms of liver injury like dark yellow or brown urine; general ill feeling or flu-like symptoms; light-colored stools; loss of appetite; nausea; right upper belly pain; unusually weak or tired; yellowing of the eyes or skin -trouble passing urine or change in the amount of urine -vomiting Side effects that usually do not require medical attention (report to your doctor or health care professional if they continue or are bothersome): -diarrhea -hair thinning or loss -headache -nausea -tiredness This list may not describe all possible side effects. Call your doctor for medical advice about side effects. You may report side effects to FDA at 1-800-FDA-1088. Where should I keep my medicine? Keep out of the reach of children. Store at room temperature between 15 and 30 degrees C (59 and 86 degrees F). Protect from moisture and light. Throw away any unused medicine after the expiration date. NOTE: This sheet is a summary. It may not cover all possible information. If you have questions about this medicine, talk to your doctor, pharmacist, or health care provider.  2019 Elsevier/Gold Standard (2016-03-18 13:49:00)

## 2018-07-08 NOTE — Progress Notes (Signed)
Pharmacy Note  Subjective: Patient presents today to the [facility] to see Dr. Estanislado Pandy.  Patient seen by the pharmacist for counseling on leflunomide Jolee [place]) for rheumatoid arthritis. Her current regimen includes Plaquenil 200 mg twice daily with inadequate response.  She has declined methotrexate in the past due to side effect of hair loss and previous provider applied for Enbrel but she was unable to afford.  Objective: CBC    Component Value Date/Time   WBC 6.2 06/17/2018 1000   RBC 4.38 06/17/2018 1000   HGB 12.9 06/17/2018 1000   HCT 37.9 06/17/2018 1000   PLT 282 06/17/2018 1000   MCV 86.5 06/17/2018 1000   MCH 29.5 06/17/2018 1000   MCHC 34.0 06/17/2018 1000   RDW 12.5 06/17/2018 1000   LYMPHSABS 2,195 06/17/2018 1000   EOSABS 657 (H) 06/17/2018 1000   BASOSABS 37 06/17/2018 1000    CMP     Component Value Date/Time   NA 136 06/17/2018 1000   K 4.4 06/17/2018 1000   CL 100 06/17/2018 1000   CO2 28 06/17/2018 1000   GLUCOSE 88 06/17/2018 1000   BUN 11 06/17/2018 1000   CREATININE 0.77 06/17/2018 1000   CALCIUM 9.8 06/17/2018 1000   PROT 7.9 06/17/2018 1000   PROT 8.0 06/17/2018 1000   AST 19 06/17/2018 1000   ALT 14 06/17/2018 1000   BILITOT 0.7 06/17/2018 1000   GFRNONAA 94 06/17/2018 1000   GFRAA 109 06/17/2018 1000    Baseline Immunosuppressant Therapy Labs Quantiferon TB Gold Latest Ref Rng & Units 06/17/2018  Quantiferon TB Gold Plus NEGATIVE NEGATIVE    Hepatitis Latest Ref Rng & Units 06/17/2018  Hep B Surface Ag NON-REACTI NON-REACTIVE  Hep B IgM NON-REACTI NON-REACTIVE  Hep C Ab NON-REACTI NON-REACTIVE  Hep C Ab NON-REACTI NON-REACTIVE    Lab Results  Component Value Date   HIV REPEATEDLY REACTIVE (A) 06/17/2018   Lab Results  Component Value Date   HIV1RNAQUAL Not Detected 06/17/2018   Lab Results  Component Value Date   HIV1ANTIBODY NEGATIVE 06/17/2018    Immunoglobulin Electrophoresis Latest Ref Rng & Units 06/17/2018   IgA  47 - 310 mg/dL 406(H)  IgG 600 - 1,640 mg/dL 1,658(H)  IgM 50 - 300 mg/dL 132    Serum Protein Electrophoresis Latest Ref Rng & Units 06/17/2018  Total Protein 6.1 - 8.1 g/dL 8.0  Albumin 3.8 - 4.8 g/dL 4.2  Alpha-1 0.2 - 0.3 g/dL 0.3  Alpha-2 0.5 - 0.9 g/dL 0.8  Beta Globulin 0.4 - 0.6 g/dL 0.5  Beta 2 0.2 - 0.5 g/dL 0.5  Gamma Globulin 0.8 - 1.7 g/dL 1.7    No results found for: G6PDH  No results found for: TPMT   Pregnancy status:  Not-pregnant/ husband had a vasectomy  Alcohol use: on occassion.  Chest x-ray: no active cardiopulmonary disease 06/21/2018  Assessment/Plan:  Patient was counseled on the purpose, proper use, and adverse effects of leflunomide including risk of infection, nausea/diarrhea/weight loss, increase in blood pressure, rash, hair loss, tingling in the hands and feet, and signs and symptoms of interstitial lung disease.   Also counseled on [place] warning of liver injury and importance of avoiding alcohol while on therapy. Discussed that there is the possibility of an increased risk of malignancy but it is not well understood if this increased risk is due to the medication or the disease state.  Counseled patient to avoid live vaccines. Recommend annual influenza, Pneumovax 23, Prevnar 13, and Shingrix as indicated.  Discussed the importance of frequent monitoring of liver function and blood count.  Standing orders placed.  Discussed importance of birth control while on leflunomide due to risk of congenital abnormalities, and patient confirms husband had a vasectomy.  Provided patient with educational materials on leflunomide and answered all questions.  Patient consented to [place] use, and consent will be uploaded into the media tab.    Patient dose will be Arava 20 mg daily due to disease severity.  Prescription sent to [facility] pharmacy per patient request.   If she has inadequate response will consider Orencia or Enbrel.  Patient also will be  starting a prednisone taper of 20 mg decreasing by 5 mg every 4 days.  Counseled on common side effects of nausea, insomnia, flushing, and anxiety. Recommend taking daily with breakfast.  Patient verbalized understanding.  All questions encouraged and answered.  Instructed patient to call with any further questions or concerns.  Mariella Saa, PharmD, [facility] Rheumatology Clinical Pharmacist  07/08/2018 10:28 AM

## 2018-07-09 ENCOUNTER — Telehealth: Payer: Self-pay | Admitting: Rheumatology

## 2018-07-09 NOTE — Telephone Encounter (Signed)
Please send over last ov note to Dr. Zigmund Daniel for patient's appt on Monday. Fax# [phone]

## 2018-07-09 NOTE — Telephone Encounter (Signed)
Office note faxed to Dr. Zigmund Daniel.

## 2018-07-12 ENCOUNTER — Other Ambulatory Visit: Payer: Self-pay

## 2018-07-12 ENCOUNTER — Encounter (INDEPENDENT_AMBULATORY_CARE_PROVIDER_SITE_OTHER): Payer: BC Managed Care – PPO | Admitting: Ophthalmology

## 2018-07-12 DIAGNOSIS — M069 Rheumatoid arthritis, unspecified: Secondary | ICD-10-CM

## 2018-07-12 DIAGNOSIS — H2513 Age-related nuclear cataract, bilateral: Secondary | ICD-10-CM

## 2018-07-12 DIAGNOSIS — H43813 Vitreous degeneration, bilateral: Secondary | ICD-10-CM | POA: Diagnosis not present

## 2018-07-13 ENCOUNTER — Other Ambulatory Visit: Payer: Self-pay | Admitting: Pharmacist

## 2018-07-13 DIAGNOSIS — M0579 Rheumatoid arthritis with rheumatoid factor of multiple sites without organ or systems involvement: Secondary | ICD-10-CM

## 2018-07-13 MED ORDER — HYDROXYCHLOROQUINE SULFATE 200 MG PO TABS: ORAL_TABLET | ORAL | 2 refills | Status: DC

## 2018-07-13 MED ORDER — HYDROXYCHLOROQUINE SULFATE 200 MG PO TABS: 300.0000 mg | ORAL_TABLET | Freq: Every day | ORAL | 2 refills | Status: DC

## 2018-07-13 NOTE — Progress Notes (Signed)
Received fax from opthamologist recommending dose reduction for PLQ.  New prescription sent to the pharmacy with decreased dosing.    Patient notified and instructed to take 1 tablet in the morning and 1/2 tablet in the evening.  Patient verbalized understanding.  All questions encouraged and answered.  Instructed patient to call with any further questions or concerns.  Will send document to the scan center.  Mariella Saa, PharmD, BCACP Rheumatology Clinical Pharmacist  07/13/2018 1:05 PM

## 2018-07-15 ENCOUNTER — Ambulatory Visit: Payer: Self-pay | Admitting: Rheumatology

## 2018-07-15 ENCOUNTER — Telehealth: Payer: Self-pay | Admitting: Pharmacist

## 2018-07-15 NOTE — Telephone Encounter (Signed)
Patient called with question about [place].  She states her period started on 07/06/18 and typically last a week. Her cycle has last longer and has continued and wanted to know if Arava could be contributing.  Informed patient that menstrual cycle changes are not associated with Arava use.  Instructed patient to follow up with PCP or OBGYN.  Patient verbalized understanding and will schedule appointment with Dr. Drema Dallas.  All questions encouraged and answered.  Instructed patient to call with any further questions or concerns.  Mariella Saa, PharmD, [place] Rheumatology Clinical Pharmacist  07/15/2018 1:19 PM

## 2018-07-29 ENCOUNTER — Other Ambulatory Visit: Payer: Self-pay

## 2018-07-29 DIAGNOSIS — Z79899 Other long term (current) drug therapy: Secondary | ICD-10-CM

## 2018-07-30 LAB — CBC WITH DIFFERENTIAL/PLATELET
Absolute Monocytes: 584 cells/uL (ref 200–950)
Basophils Absolute: 53 cells/uL (ref 0–200)
Basophils Relative: 0.9 %
Eosinophils Absolute: 171 cells/uL (ref 15–500)
Eosinophils Relative: 2.9 %
HCT: 38.3 % (ref 35.0–45.0)
Hemoglobin: 12.5 g/dL (ref 11.7–15.5)
Lymphs Abs: 2331 cells/uL (ref 850–3900)
MCH: 29.3 pg (ref 27.0–33.0)
MCHC: 32.6 g/dL (ref 32.0–36.0)
MCV: 89.9 fL (ref 80.0–100.0)
MPV: 9.4 fL (ref 7.5–12.5)
Monocytes Relative: 9.9 %
Neutro Abs: 2761 cells/uL (ref 1500–7800)
Neutrophils Relative %: 46.8 %
Platelets: 309 10*3/uL (ref 140–400)
RBC: 4.26 10*6/uL (ref 3.80–5.10)
RDW: 12.9 % (ref 11.0–15.0)
Total Lymphocyte: 39.5 %
WBC: 5.9 10*3/uL (ref 3.8–10.8)

## 2018-07-30 LAB — COMPLETE METABOLIC PANEL WITH GFR
AG Ratio: 1.3 (calc) (ref 1.0–2.5)
ALT: 14 U/L (ref 6–29)
AST: 16 U/L (ref 10–30)
Albumin: 4 g/dL (ref 3.6–5.1)
Alkaline phosphatase (APISO): 31 U/L (ref 31–125)
BUN: 14 mg/dL (ref 7–25)
CO2: 30 mmol/L (ref 20–32)
Calcium: 9.5 mg/dL (ref 8.6–10.2)
Chloride: 101 mmol/L (ref 98–110)
Creat: 0.75 mg/dL (ref 0.50–1.10)
GFR, Est African American: 112 mL/min/{1.73_m2} (ref >=60)
GFR, Est Non African American: 97 mL/min/{1.73_m2} (ref >=60)
Globulin: 3.1 g/dL (calc) (ref 1.9–3.7)
Glucose, Bld: 84 mg/dL (ref 65–99)
Potassium: 4.2 mmol/L (ref 3.5–5.3)
Sodium: 137 mmol/L (ref 135–146)
Total Bilirubin: 0.6 mg/dL (ref 0.2–1.2)
Total Protein: 7.1 g/dL (ref 6.1–8.1)

## 2018-08-23 ENCOUNTER — Other Ambulatory Visit: Payer: Self-pay | Admitting: Family Medicine

## 2018-08-23 DIAGNOSIS — Z1231 Encounter for screening mammogram for malignant neoplasm of breast: Secondary | ICD-10-CM

## 2018-08-31 NOTE — Progress Notes (Deleted)
   Office Visit Note  Patient: Kristi Aguirre             Date of Birth: 1975/07/18           MRN: [id number]             PCP: Leighton Ruff, MD Referring: Leighton Ruff, MD Visit Date: 09/14/2018 Occupation: @GUAROCC @  Subjective:  No chief complaint on file.   History of Present Illness: Kristi Aguirre is a 43 y.o. female ***   Activities of Daily Living:  Patient reports morning stiffness for *** {minute/hour:19697}.   Patient {ACTIONS;DENIES/REPORTS:21021675::"Denies"} nocturnal pain.  Difficulty dressing/grooming: {ACTIONS;DENIES/REPORTS:21021675::"Denies"} Difficulty climbing stairs: {ACTIONS;DENIES/REPORTS:21021675::"Denies"} Difficulty getting out of chair: {ACTIONS;DENIES/REPORTS:21021675::"Denies"} Difficulty using hands for taps, buttons, cutlery, and/or writing: {ACTIONS;DENIES/REPORTS:21021675::"Denies"}  No Rheumatology ROS completed.   PMFS History:  Patient Active Problem List   Diagnosis Date Noted  . Rheumatoid arthritis with rheumatoid factor of multiple sites without organ or systems involvement ([place]) 06/29/2018  . Positive ANA (antinuclear antibody) 06/29/2018  . History of hypothyroidism 06/29/2018  . History of IBS 06/29/2018  . Hypercholesterolemia 06/29/2018  . Prediabetes 06/29/2018  . Moderate mitral regurgitation 06/29/2018  . History of rheumatic fever 06/29/2018    No past medical history on file.  Family History  Problem Relation Age of Onset  . Thyroid disease Sister    Past Surgical History:  Procedure Laterality Date  . TOTAL SHOULDER ARTHROPLASTY     Social History   Social History Narrative  . Not on file    There is no immunization history on file for this patient.   Objective: Vital Signs: There were no vitals taken for this visit.   Physical Exam   Musculoskeletal Exam: ***  CDAI Exam: CDAI Score: - Patient Global: -; Provider Global: - Swollen: -; Tender: - Joint Exam   No joint exam has been  documented for this visit   There is currently no information documented on the homunculus. Go to the Rheumatology activity and complete the homunculus joint exam.  Investigation: No additional findings.  Imaging: No results found.  Recent Labs: Lab Results  Component Value Date   WBC 5.9 07/29/2018   HGB 12.5 07/29/2018   PLT 309 07/29/2018   NA 137 07/29/2018   K 4.2 07/29/2018   CL 101 07/29/2018   CO2 30 07/29/2018   GLUCOSE 84 07/29/2018   BUN 14 07/29/2018   CREATININE 0.75 07/29/2018   BILITOT 0.6 07/29/2018   AST 16 07/29/2018   ALT 14 07/29/2018   PROT 7.1 07/29/2018   CALCIUM 9.5 07/29/2018   GFRAA 112 07/29/2018   QFTBGOLDPLUS NEGATIVE 06/17/2018    Speciality Comments: No specialty comments available.  Procedures:  No procedures performed Allergies: Patient has no known allergies.   Assessment / Plan:     Visit Diagnoses: No diagnosis found.  Orders: No orders of the defined types were placed in this encounter.  No orders of the defined types were placed in this encounter.   Face-to-face time spent with patient was *** minutes. Greater than 50% of time was spent in counseling and coordination of care.  Follow-Up Instructions: No follow-ups on file.   Earnestine Mealing, CMA  Note - This record has been created using Editor, commissioning.  Chart creation errors have been sought, but may not always  have been located. Such creation errors do not reflect on  the standard of medical care.

## 2018-09-08 ENCOUNTER — Encounter: Payer: Self-pay | Admitting: Rheumatology

## 2018-09-08 NOTE — Telephone Encounter (Signed)
She can schedule a follow-up appointment 6 months from her prior visit which will be in December.

## 2018-09-14 ENCOUNTER — Ambulatory Visit: Payer: BC Managed Care – PPO | Admitting: Rheumatology

## 2018-10-05 ENCOUNTER — Other Ambulatory Visit: Payer: Self-pay | Admitting: Rheumatology

## 2018-10-05 ENCOUNTER — Ambulatory Visit: Payer: BC Managed Care – PPO

## 2018-10-05 ENCOUNTER — Encounter: Payer: Self-pay | Admitting: *Deleted

## 2018-10-05 ENCOUNTER — Other Ambulatory Visit: Payer: Self-pay | Admitting: Family Medicine

## 2018-10-05 DIAGNOSIS — N6320 Unspecified lump in the left breast, unspecified quadrant: Secondary | ICD-10-CM

## 2018-10-05 DIAGNOSIS — M0579 Rheumatoid arthritis with rheumatoid factor of multiple sites without organ or systems involvement: Secondary | ICD-10-CM

## 2018-10-05 MED ORDER — HYDROXYCHLOROQUINE SULFATE 200 MG PO TABS: ORAL_TABLET | ORAL | 2 refills | Status: DC

## 2018-10-05 NOTE — Telephone Encounter (Addendum)
Last Visit: 07/08/18 Next Visit: 01/12/19  Labs: 07/29/18 WNL Eye exam: 07/12/18  Okay to refill per Dr. Estanislado Pandy

## 2018-10-20 ENCOUNTER — Ambulatory Visit
Admission: RE | Admit: 2018-10-20 | Discharge: 2018-10-20 | Disposition: A | Discharge status: Home / Self Care | Payer: BC Managed Care – PPO | Source: Ambulatory Visit | Attending: Family Medicine | Admitting: Family Medicine

## 2018-10-20 ENCOUNTER — Other Ambulatory Visit: Payer: Self-pay

## 2018-10-20 DIAGNOSIS — N6320 Unspecified lump in the left breast, unspecified quadrant: Secondary | ICD-10-CM

## 2019-01-11 NOTE — Progress Notes (Deleted)
Virtual Visit via Video Note  I connected with Kristi Aguirre on 01/11/19 at  1:00 PM EST by a video enabled telemedicine application and verified that I am speaking with the correct person using two identifiers.  Location: Patient: Home  Provider: Clinic  This service was conducted via virtual visit.  Both audio and visual tools were used.  The patient was located at home. I was located in my office.  Consent was obtained prior to the virtual visit and is aware of possible charges through their insurance for this visit.  The patient is an established patient.  Dr. Estanislado Pandy, MD conducted the virtual visit and Hazel Sams, PA-C acted as scribe during the service.  Office staff helped with scheduling follow up visits after the service was conducted.   I discussed the limitations of evaluation and management by telemedicine and the availability of in person appointments. The patient expressed understanding and agreed to proceed.  CC: History of Present Illness: Patient is a 43 year old female with a past medical history of   Review of Systems  Constitutional: Negative for fever and malaise/fatigue.  Eyes: Negative for photophobia, pain, discharge and redness.  Respiratory: Negative for cough, shortness of breath and wheezing.   Cardiovascular: Negative for chest pain and palpitations.  Gastrointestinal: Negative for blood in stool, constipation and diarrhea.  Genitourinary: Negative for dysuria.  Musculoskeletal: Negative for back pain, joint pain, myalgias and neck pain.  Skin: Negative for rash.  Neurological: Negative for dizziness and headaches.  Psychiatric/Behavioral: Negative for depression. The patient is not nervous/anxious and does not have insomnia.       Observations/Objective: Physical Exam  Constitutional: She is oriented to person, place, and time and well-developed, well-nourished, and in no distress.  HENT:  Head: Normocephalic and atraumatic.  Eyes: Conjunctivae  are normal.  Pulmonary/Chest: Effort normal.  Neurological: She is alert and oriented to person, place, and time.  Psychiatric: Mood, memory, affect and judgment normal.   Patient reports morning stiffness for  {minute/hour:19697}.   Patient {Actions; denies-reports:120008} nocturnal pain.  Difficulty dressing/grooming: {ACTIONS;DENIES/REPORTS:21021675::"Denies"} Difficulty climbing stairs: {ACTIONS;DENIES/REPORTS:21021675::"Denies"} Difficulty getting out of chair: {ACTIONS;DENIES/REPORTS:21021675::"Denies"} Difficulty using hands for taps, buttons, cutlery, and/or writing: {ACTIONS;DENIES/REPORTS:21021675::"Denies"}   Assessment and Plan: multiple sites without organ or systems involvement (HCC) - RF 94.2, anti-CCP> 250, ANA negative, dsDNA positive, SSA positive.  Diagnosed by [facility] rheumatology.  Patient has severe rheumatoid arthritis with synovitis involving multiple joints.  She is currently on Plaquenil.  Added on [place] after last visit.  Did not want to start on MTX due to risk of hair loss   High risk medication use - Inadequate response to Plaquenil 200 mg p.o. twice daily.  HIV RNA quant negative.  Positive ANA (antinuclear antibody) - Double-stranded DNA positive, Ro positive.  Patient has no features of lupus or Sjogren's.  Hair loss-she has had chronic hair loss.  I did made her aware that [place] sometimes can contribute to hair loss as well.  History of hypothyroidism  History of IBS  Hypercholesterolemia  Prediabetes  Moderate mitral regurgitation  History of rheumatic fever   Follow Up Instructions: She will follow up in    I discussed the assessment and treatment plan with the patient. The patient was provided an opportunity to ask questions and all were answered. The patient agreed with the plan and demonstrated an understanding of the instructions.   The patient was advised to call back or seek an in-person evaluation if the symptoms worsen or  if the  condition fails to improve as anticipated.  I provided *** minutes of non-face-to-face time during this encounter.   Ofilia Neas, PA-C

## 2019-01-12 ENCOUNTER — Other Ambulatory Visit: Payer: Self-pay

## 2019-01-12 ENCOUNTER — Telehealth: Payer: BC Managed Care – PPO | Admitting: Rheumatology

## 2019-02-01 ENCOUNTER — Other Ambulatory Visit: Payer: Self-pay | Admitting: Rheumatology

## 2019-02-01 DIAGNOSIS — M0579 Rheumatoid arthritis with rheumatoid factor of multiple sites without organ or systems involvement: Secondary | ICD-10-CM

## 2019-02-17 ENCOUNTER — Telehealth: Payer: Self-pay | Admitting: Rheumatology

## 2019-02-17 DIAGNOSIS — Z79899 Other long term (current) drug therapy: Secondary | ICD-10-CM

## 2019-02-17 DIAGNOSIS — M0579 Rheumatoid arthritis with rheumatoid factor of multiple sites without organ or systems involvement: Secondary | ICD-10-CM

## 2019-02-17 MED ORDER — HYDROXYCHLOROQUINE SULFATE 200 MG PO TABS: ORAL_TABLET | ORAL | 0 refills | Status: DC

## 2019-02-17 NOTE — Telephone Encounter (Signed)
She requires lab work ASAP.  Ok to refill 30 day supply.

## 2019-02-17 NOTE — Telephone Encounter (Signed)
Patient called stating she received a call from [place] that Dr. Estanislado Pandy has not approved her prescription refill of Plaquenil.  Patient states she had her Plaquenil eye exam over the summer.  Patient couldn't remember the eye doctor's name, but said it was "the one down the hall in the same building as Dr. Arlean Hopping office."

## 2019-02-17 NOTE — Telephone Encounter (Signed)
Last Visit: 07/08/2018 Next Visit: message sent to the front desk to schedule.  Labs: 07/29/2018 CBC and CMP WNL Eye exam: 07/12/2018  Advised patient she is due to update labs, patient verbalized understanding and will go to [facility] in [place]. Orders have been released.   Okay to refill 30 day supply of PLQ?

## 2019-02-23 LAB — CBC WITH DIFFERENTIAL/PLATELET
Basophils Absolute: 0 10*3/uL (ref 0.0–0.2)
Basos: 1 %
EOS (ABSOLUTE): 0.1 10*3/uL (ref 0.0–0.4)
Eos: 2 %
Hematocrit: 39.2 % (ref 34.0–46.6)
Hemoglobin: 13 g/dL (ref 11.1–15.9)
Immature Grans (Abs): 0 10*3/uL (ref 0.0–0.1)
Immature Granulocytes: 0 %
Lymphocytes Absolute: 2.4 10*3/uL (ref 0.7–3.1)
Lymphs: 45 %
MCH: 29.6 pg (ref 26.6–33.0)
MCHC: 33.2 g/dL (ref 31.5–35.7)
MCV: 89 fL (ref 79–97)
Monocytes Absolute: 0.6 10*3/uL (ref 0.1–0.9)
Monocytes: 10 %
Neutrophils Absolute: 2.3 10*3/uL (ref 1.4–7.0)
Neutrophils: 42 %
Platelets: 278 10*3/uL (ref 150–450)
RBC: 4.39 x10E6/uL (ref 3.77–5.28)
RDW: 12.5 % (ref 11.7–15.4)
WBC: 5.4 10*3/uL (ref 3.4–10.8)

## 2019-02-23 LAB — CMP14+EGFR
ALT: 15 IU/L (ref 0–32)
AST: 22 IU/L (ref 0–40)
Albumin/Globulin Ratio: 1.6 (ref 1.2–2.2)
Albumin: 4.7 g/dL (ref 3.8–4.8)
Alkaline Phosphatase: 35 IU/L — ABNORMAL LOW (ref 39–117)
BUN/Creatinine Ratio: 8 — ABNORMAL LOW (ref 9–23)
BUN: 6 mg/dL (ref 6–24)
Bilirubin Total: 0.5 mg/dL (ref 0.0–1.2)
CO2: 27 mmol/L (ref 20–29)
Calcium: 10.1 mg/dL (ref 8.7–10.2)
Chloride: 99 mmol/L (ref 96–106)
Creatinine, Ser: 0.78 mg/dL (ref 0.57–1.00)
GFR calc Af Amer: 106 mL/min/{1.73_m2} (ref >59)
GFR calc non Af Amer: 92 mL/min/{1.73_m2} (ref >59)
Globulin, Total: 2.9 g/dL (ref 1.5–4.5)
Glucose: 82 mg/dL (ref 65–99)
Potassium: 3.8 mmol/L (ref 3.5–5.2)
Sodium: 139 mmol/L (ref 134–144)
Total Protein: 7.6 g/dL (ref 6.0–8.5)

## 2019-02-23 NOTE — Telephone Encounter (Signed)
CBC and CMP are stable.

## 2019-02-28 ENCOUNTER — Telehealth: Payer: Self-pay | Admitting: Rheumatology

## 2019-02-28 NOTE — Telephone Encounter (Signed)
I LMOM for patient to call, ands schedule a follow up appointment. Patient is over due.

## 2019-02-28 NOTE — Telephone Encounter (Signed)
-----   Message from [place] sent at 02/17/2019 10:34 AM EST ----- Patient is due for a follow up appointment. Thanks!

## 2019-03-07 ENCOUNTER — Other Ambulatory Visit: Payer: Self-pay | Admitting: Physician Assistant

## 2019-03-07 DIAGNOSIS — M0579 Rheumatoid arthritis with rheumatoid factor of multiple sites without organ or systems involvement: Secondary | ICD-10-CM

## 2019-03-08 NOTE — Progress Notes (Signed)
Office Visit Note  Patient: Kristi Aguirre             Date of Birth: 03/03/1975           MRN: [id number]             PCP: Leighton Ruff, MD Referring: Leighton Ruff, MD Visit Date: 03/09/2019 Occupation: @GUAROCC @  Subjective:  Swelling in left hand   History of Present Illness: Kristi Aguirre is a 44 y.o. female with seropositive rheumatoid arthritis.  She returns today after her last visit in June 2020.  At the time she presented with severe inflammatory arthritis.  She was a started on [place] in addition to Plaquenil.  She states she took [place] for about 1 month and discontinued due to stomach cramps.  She states over time her symptoms improved.  She continues to have some pain and swelling in her left second MCP joint.  None of the other joints are painful today.  Activities of Daily Living:  Patient reports morning stiffness for 3 minutes.   Patient Denies nocturnal pain.  Difficulty dressing/grooming: Denies Difficulty climbing stairs: Denies Difficulty getting out of chair: Denies Difficulty using hands for taps, buttons, cutlery, and/or writing: Denies  Review of Systems  Constitutional: Negative for fatigue, night sweats, weight gain and weight loss.  HENT: Negative for mouth sores, trouble swallowing, trouble swallowing, mouth dryness and nose dryness.   Eyes: Negative for pain, redness, visual disturbance and dryness.  Respiratory: Negative for cough, shortness of breath and difficulty breathing.   Cardiovascular: Negative for chest pain, palpitations, hypertension, irregular heartbeat and swelling in legs/feet.  Gastrointestinal: Negative for blood in stool, constipation and diarrhea.  Endocrine: Negative for cold intolerance and increased urination.  Genitourinary: Negative for difficulty urinating and vaginal dryness.  Musculoskeletal: Positive for joint swelling and morning stiffness. Negative for arthralgias, joint pain, myalgias, muscle weakness,  muscle tenderness and myalgias.  Skin: Negative for color change, rash, hair loss, skin tightness, ulcers and sensitivity to sunlight.  Allergic/Immunologic: Negative for susceptible to infections.  Neurological: Negative for dizziness, memory loss, night sweats and weakness.  Hematological: Negative for bruising/bleeding tendency and swollen glands.  Psychiatric/Behavioral: Negative for depressed mood and sleep disturbance. The patient is not nervous/anxious.     PMFS History:  Patient Active Problem List   Diagnosis Date Noted  . Rheumatoid arthritis with rheumatoid factor of multiple sites without organ or systems involvement ([place]) 06/29/2018  . Positive ANA (antinuclear antibody) 06/29/2018  . History of hypothyroidism 06/29/2018  . History of IBS 06/29/2018  . Hypercholesterolemia 06/29/2018  . Prediabetes 06/29/2018  . Moderate mitral regurgitation 06/29/2018  . History of rheumatic fever 06/29/2018    Past Medical History:  Diagnosis Date  . Rheumatoid arthritis ([place])     Family History  Problem Relation Age of Onset  . Thyroid disease Sister    Past Surgical History:  Procedure Laterality Date  . TOTAL SHOULDER ARTHROPLASTY     Social History   Social History Narrative  . Not on file    There is no immunization history on file for this patient.   Objective: Vital Signs: BP 104/66 (BP Location: Left Arm, Patient Position: Sitting, Cuff Size: Normal)   Pulse 78   Resp 14   Ht 5\' 1"  (1.549 m)   Wt 133 lb 12.8 oz (60.7 kg)   LMP 03/09/2019   BMI 25.28 kg/m    Physical Exam Vitals and nursing note reviewed.  Constitutional:      Appearance: She  is well-developed.  HENT:     Head: Normocephalic and atraumatic.  Eyes:     Conjunctiva/sclera: Conjunctivae normal.  Cardiovascular:     Rate and Rhythm: Normal rate and regular rhythm.     Heart sounds: Normal heart sounds.  Pulmonary:     Effort: Pulmonary effort is normal.     Breath sounds: Normal breath  sounds.  Abdominal:     General: Bowel sounds are normal.     Palpations: Abdomen is soft.  Musculoskeletal:     Cervical back: Normal range of motion.  Lymphadenopathy:     Cervical: No cervical adenopathy.  Skin:    General: Skin is warm and dry.     Capillary Refill: Capillary refill takes less than 2 seconds.  Neurological:     Mental Status: She is alert and oriented to person, place, and time.  Psychiatric:        Behavior: Behavior normal.      Musculoskeletal Exam: C-spine thoracic and lumbar spine were in good range of motion.  Shoulder joints, elbow joints, wrist joints were in good range of motion.  She had synovitis of her left second MCP joint.  Hip joints, knee joints, ankles, MTPs with good range of motion with no synovitis.  CDAI Exam: CDAI Score: 2.4  Patient Global: 1 mm; Provider Global: 3 mm Swollen: 1 ; Tender: 1  Joint Exam 03/09/2019      Right  Left  MCP 2     Swollen Tender     Investigation: No additional findings.  Imaging: No results found.  Recent Labs: Lab Results  Component Value Date   WBC 5.4 02/22/2019   HGB 13.0 02/22/2019   PLT 278 02/22/2019   NA 139 02/22/2019   K 3.8 02/22/2019   CL 99 02/22/2019   CO2 27 02/22/2019   GLUCOSE 82 02/22/2019   BUN 6 02/22/2019   CREATININE 0.78 02/22/2019   BILITOT 0.5 02/22/2019   ALKPHOS 35 (L) 02/22/2019   AST 22 02/22/2019   ALT 15 02/22/2019   PROT 7.6 02/22/2019   ALBUMIN 4.7 02/22/2019   CALCIUM 10.1 02/22/2019   GFRAA 106 02/22/2019   QFTBGOLDPLUS NEGATIVE 06/17/2018    Speciality Comments: PLQ Eye Exam: 07/12/18 WNL   Procedures:  No procedures performed Allergies: Patient has no known allergies.   Assessment / Plan:     Visit Diagnoses: Rheumatoid arthritis with rheumatoid factor of multiple sites without organ or systems involvement (HCC) - RF 94.2, anti-CCP> 250, ANA negative, dsDNA positive, SSA positive.  Patient continues to have synovitis.  She has left second MCP  pain and swelling.  In my opinion she needs to be on more aggressive therapy.  She has declined DMARDs and Biologics in the past.  She was given Areva at the last visit which she took for a short time and discontinued due to abdominal discomfort.  She states she has been going to [facility] and would like to try some natural therapy before going on any additional medications.  I had detailed discussion regarding the risk of developing erosions and losing joint function.  I will give her prednisone taper starting at 20 mg and taper by 5 mg every 4 days.  Side effects of prednisone were also discussed.  High risk medication use - PLQ 300 mg p.o. daily, (Arava-was prescribed June 2020 which she discontinued after 1 month due to stomach cramps.). Eye Exam: 07/12/18.  She had labs in January which were within normal limits.  Positive ANA (antinuclear antibody) - Double-stranded DNA positive, Ro positive.  Patient has no features of lupus or Sjogren's.  Hair loss-patient has been going to [facility] where she has been placed on thyroid medication per patient.  History of IBS-she has been on gluten-free and dairy free diet.  She states her symptoms are improving.  Hypercholesterolemia  Prediabetes  History of hypothyroidism  History of rheumatic fever  Moderate mitral regurgitation  Orders: No orders of the defined types were placed in this encounter.  Meds ordered this encounter  Medications  . predniSONE (DELTASONE) 5 MG tablet    Sig: Take 4 tabs po qd x 4 days, 3  tabs po qd x 4 days, 2  tabs po qd x 4 days, 1  tab po qd x 4 days    Dispense:  40 tablet    Refill:  0      Follow-Up Instructions: Return in about 3 months (around 06/06/2019) for Rheumatoid arthritis.   Bo Merino, MD  Note - This record has been created using Editor, commissioning.  Chart creation errors have been sought, but may not always  have been located. Such creation  errors do not reflect on  the standard of medical care.

## 2019-03-08 NOTE — Telephone Encounter (Signed)
Appt scheduled 03/09/2019

## 2019-03-08 NOTE — Telephone Encounter (Signed)
Last Visit: 07/08/2018 Next Visit: was due August 2020. Message sent to the front to schedule patient.  Labs: 02/22/19 stable PLQ Eye Exam: 07/12/18 WNL   Okay to refill per Dr. Estanislado Pandy

## 2019-03-08 NOTE — Telephone Encounter (Signed)
Please schedule patient for a follow up visit. Patient was due August 2020. Thanks!

## 2019-03-09 ENCOUNTER — Encounter: Payer: Self-pay | Admitting: Rheumatology

## 2019-03-09 ENCOUNTER — Other Ambulatory Visit: Payer: Self-pay

## 2019-03-09 ENCOUNTER — Ambulatory Visit: Payer: BC Managed Care – PPO | Admitting: Rheumatology

## 2019-03-09 VITALS — BP 104/66 | HR 78 | Resp 14 | Ht 61.0 in | Wt 133.8 lb

## 2019-03-09 DIAGNOSIS — R768 Other specified abnormal immunological findings in serum: Secondary | ICD-10-CM | POA: Diagnosis not present

## 2019-03-09 DIAGNOSIS — M0579 Rheumatoid arthritis with rheumatoid factor of multiple sites without organ or systems involvement: Secondary | ICD-10-CM

## 2019-03-09 DIAGNOSIS — L659 Nonscarring hair loss, unspecified: Secondary | ICD-10-CM | POA: Diagnosis not present

## 2019-03-09 DIAGNOSIS — Z8639 Personal history of other endocrine, nutritional and metabolic disease: Secondary | ICD-10-CM

## 2019-03-09 DIAGNOSIS — R7303 Prediabetes: Secondary | ICD-10-CM

## 2019-03-09 DIAGNOSIS — Z8679 Personal history of other diseases of the circulatory system: Secondary | ICD-10-CM

## 2019-03-09 DIAGNOSIS — E7800 Pure hypercholesterolemia, unspecified: Secondary | ICD-10-CM

## 2019-03-09 DIAGNOSIS — I340 Nonrheumatic mitral (valve) insufficiency: Secondary | ICD-10-CM

## 2019-03-09 DIAGNOSIS — Z79899 Other long term (current) drug therapy: Secondary | ICD-10-CM | POA: Diagnosis not present

## 2019-03-09 DIAGNOSIS — Z8719 Personal history of other diseases of the digestive system: Secondary | ICD-10-CM

## 2019-03-09 MED ORDER — PREDNISONE 5 MG PO TABS: ORAL_TABLET | ORAL | 0 refills | Status: AC

## 2019-03-29 ENCOUNTER — Other Ambulatory Visit: Payer: Self-pay | Admitting: Rheumatology

## 2019-03-29 DIAGNOSIS — M0579 Rheumatoid arthritis with rheumatoid factor of multiple sites without organ or systems involvement: Secondary | ICD-10-CM

## 2019-03-29 NOTE — Telephone Encounter (Signed)
Last Visit: 03/09/19 Next Visit: 06/08/19 Labs: 02/22/19 CBC and CMP are stable. Eye exam: 07/12/18 WNL   Okay to refill per Dr. Estanislado Pandy

## 2019-06-08 ENCOUNTER — Ambulatory Visit: Payer: BC Managed Care – PPO | Admitting: Physician Assistant

## 2019-06-20 ENCOUNTER — Other Ambulatory Visit: Payer: Self-pay | Admitting: Rheumatology

## 2019-06-20 DIAGNOSIS — M0579 Rheumatoid arthritis with rheumatoid factor of multiple sites without organ or systems involvement: Secondary | ICD-10-CM

## 2019-06-20 NOTE — Telephone Encounter (Signed)
Last Visit: 03/09/2019 Next Visit: due May 2021. Message sent to the front to schedule patient  Labs: 02/22/2019 CBC and CMP are stable Eye exam: 07/12/18 WNL   Current Dose per office note 03/09/2019: PLQ 300 mg p.o. daily  Okay to refill per Dr. Estanislado Pandy

## 2019-06-20 NOTE — Telephone Encounter (Signed)
Please schedule patient a follow up visit. Patient was due May 2021. Thanks!

## 2019-06-20 NOTE — Telephone Encounter (Signed)
I LMOM for patient to call, and schedule a return office visit.

## 2019-07-11 ENCOUNTER — Other Ambulatory Visit: Payer: Self-pay

## 2019-07-11 ENCOUNTER — Encounter (INDEPENDENT_AMBULATORY_CARE_PROVIDER_SITE_OTHER): Payer: BC Managed Care – PPO | Admitting: Ophthalmology

## 2019-07-11 DIAGNOSIS — H43813 Vitreous degeneration, bilateral: Secondary | ICD-10-CM

## 2019-07-11 DIAGNOSIS — M069 Rheumatoid arthritis, unspecified: Secondary | ICD-10-CM

## 2019-07-13 ENCOUNTER — Encounter (INDEPENDENT_AMBULATORY_CARE_PROVIDER_SITE_OTHER): Payer: BC Managed Care – PPO | Admitting: Ophthalmology

## 2019-09-11 ENCOUNTER — Other Ambulatory Visit: Payer: Self-pay | Admitting: Rheumatology

## 2019-09-11 DIAGNOSIS — M0579 Rheumatoid arthritis with rheumatoid factor of multiple sites without organ or systems involvement: Secondary | ICD-10-CM

## 2019-09-27 ENCOUNTER — Other Ambulatory Visit: Payer: Self-pay | Admitting: Family Medicine

## 2019-09-27 DIAGNOSIS — Z1231 Encounter for screening mammogram for malignant neoplasm of breast: Secondary | ICD-10-CM

## 2019-10-21 ENCOUNTER — Other Ambulatory Visit: Payer: Self-pay

## 2019-10-21 ENCOUNTER — Ambulatory Visit
Admission: RE | Admit: 2019-10-21 | Discharge: 2019-10-21 | Disposition: A | Discharge status: Home / Self Care | Payer: BC Managed Care – PPO | Source: Ambulatory Visit | Attending: Family Medicine | Admitting: Family Medicine

## 2019-10-21 DIAGNOSIS — Z1231 Encounter for screening mammogram for malignant neoplasm of breast: Secondary | ICD-10-CM

## 2020-03-27 IMAGING — DX CHEST - 2 VIEW
2 series · 2 of 2 positions shown · non-contrast
Comparison: None.

CLINICAL DATA: Immunosuppressive therapy.  Rheumatoid arthritis.

EXAM:
CHEST - 2 VIEW

[chest pa]
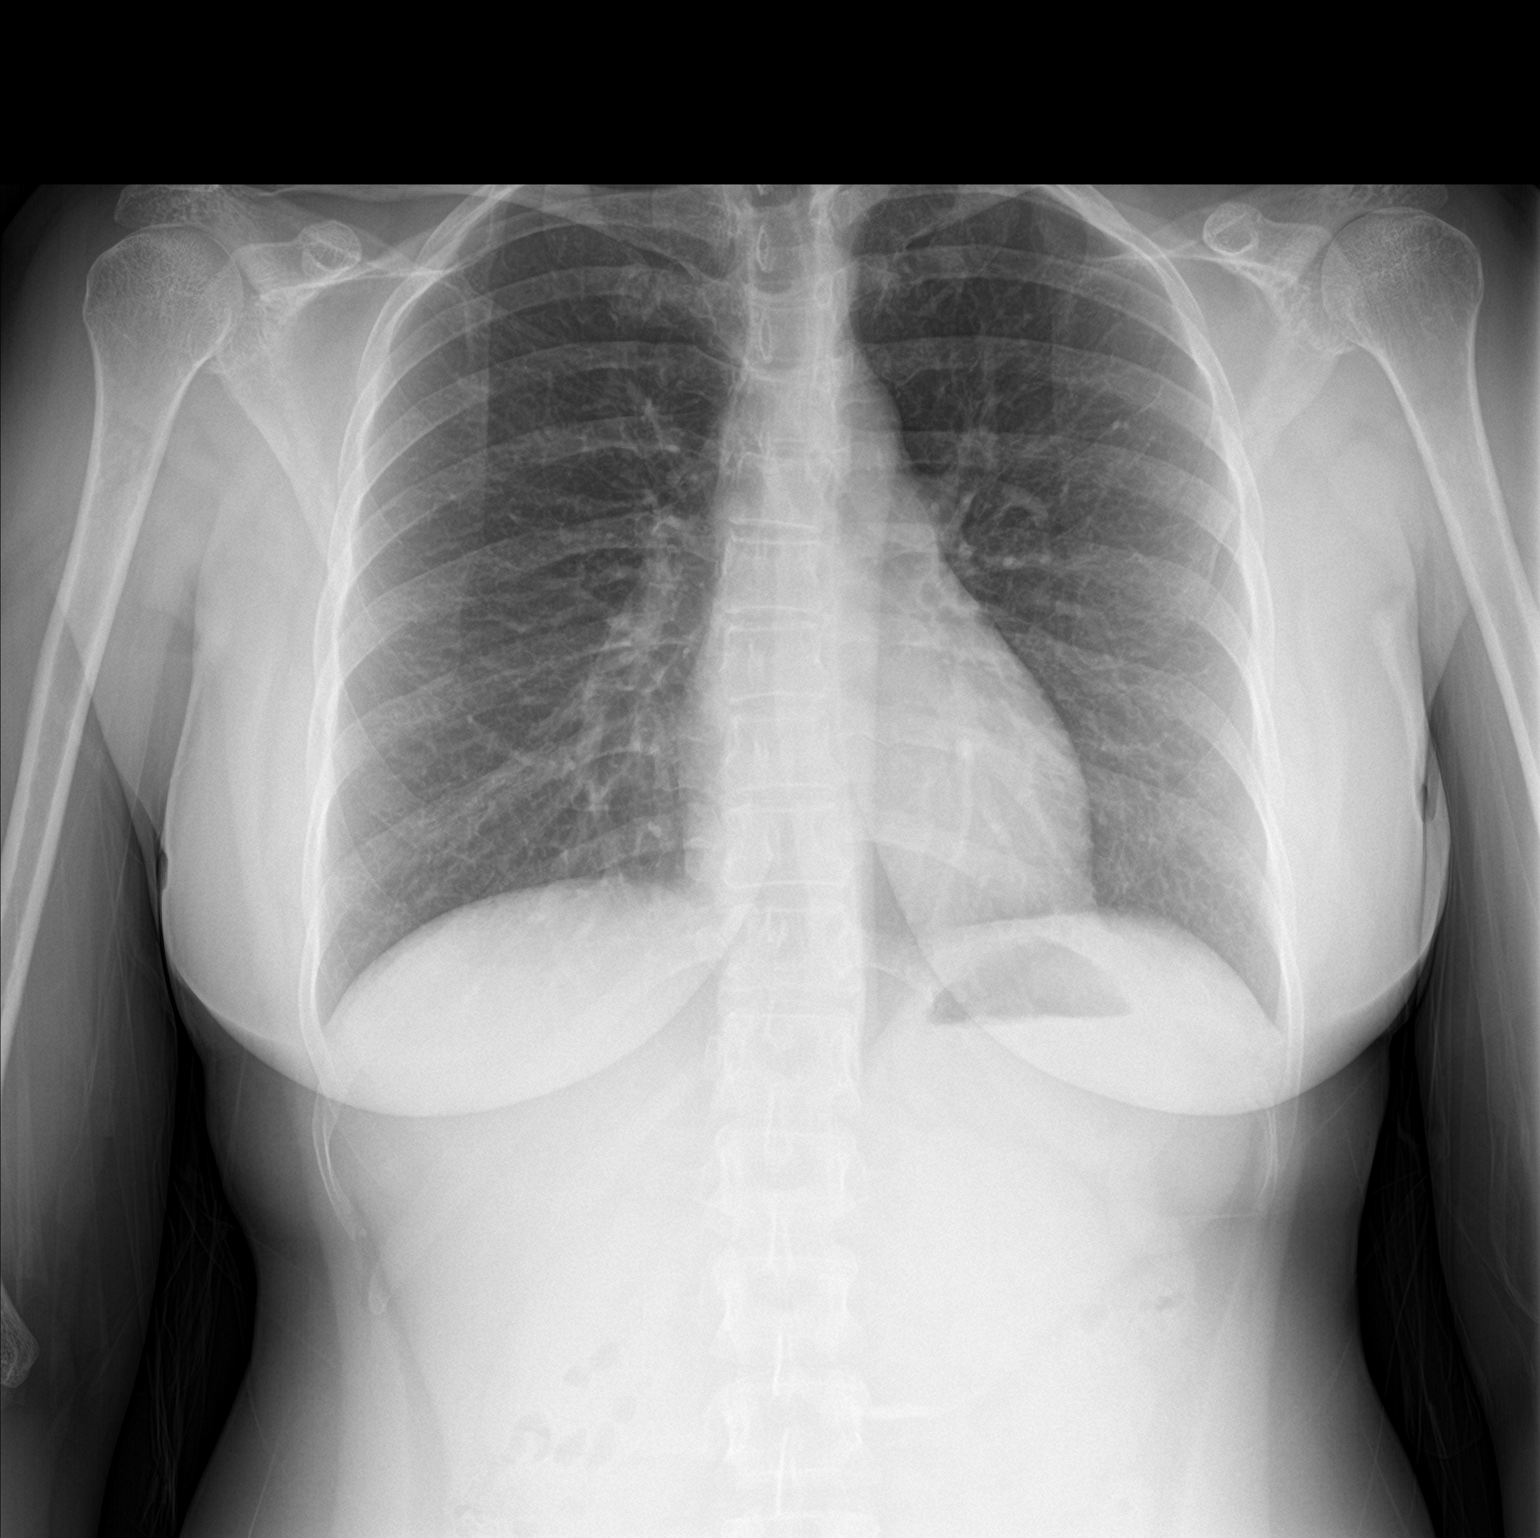

[chest lat]
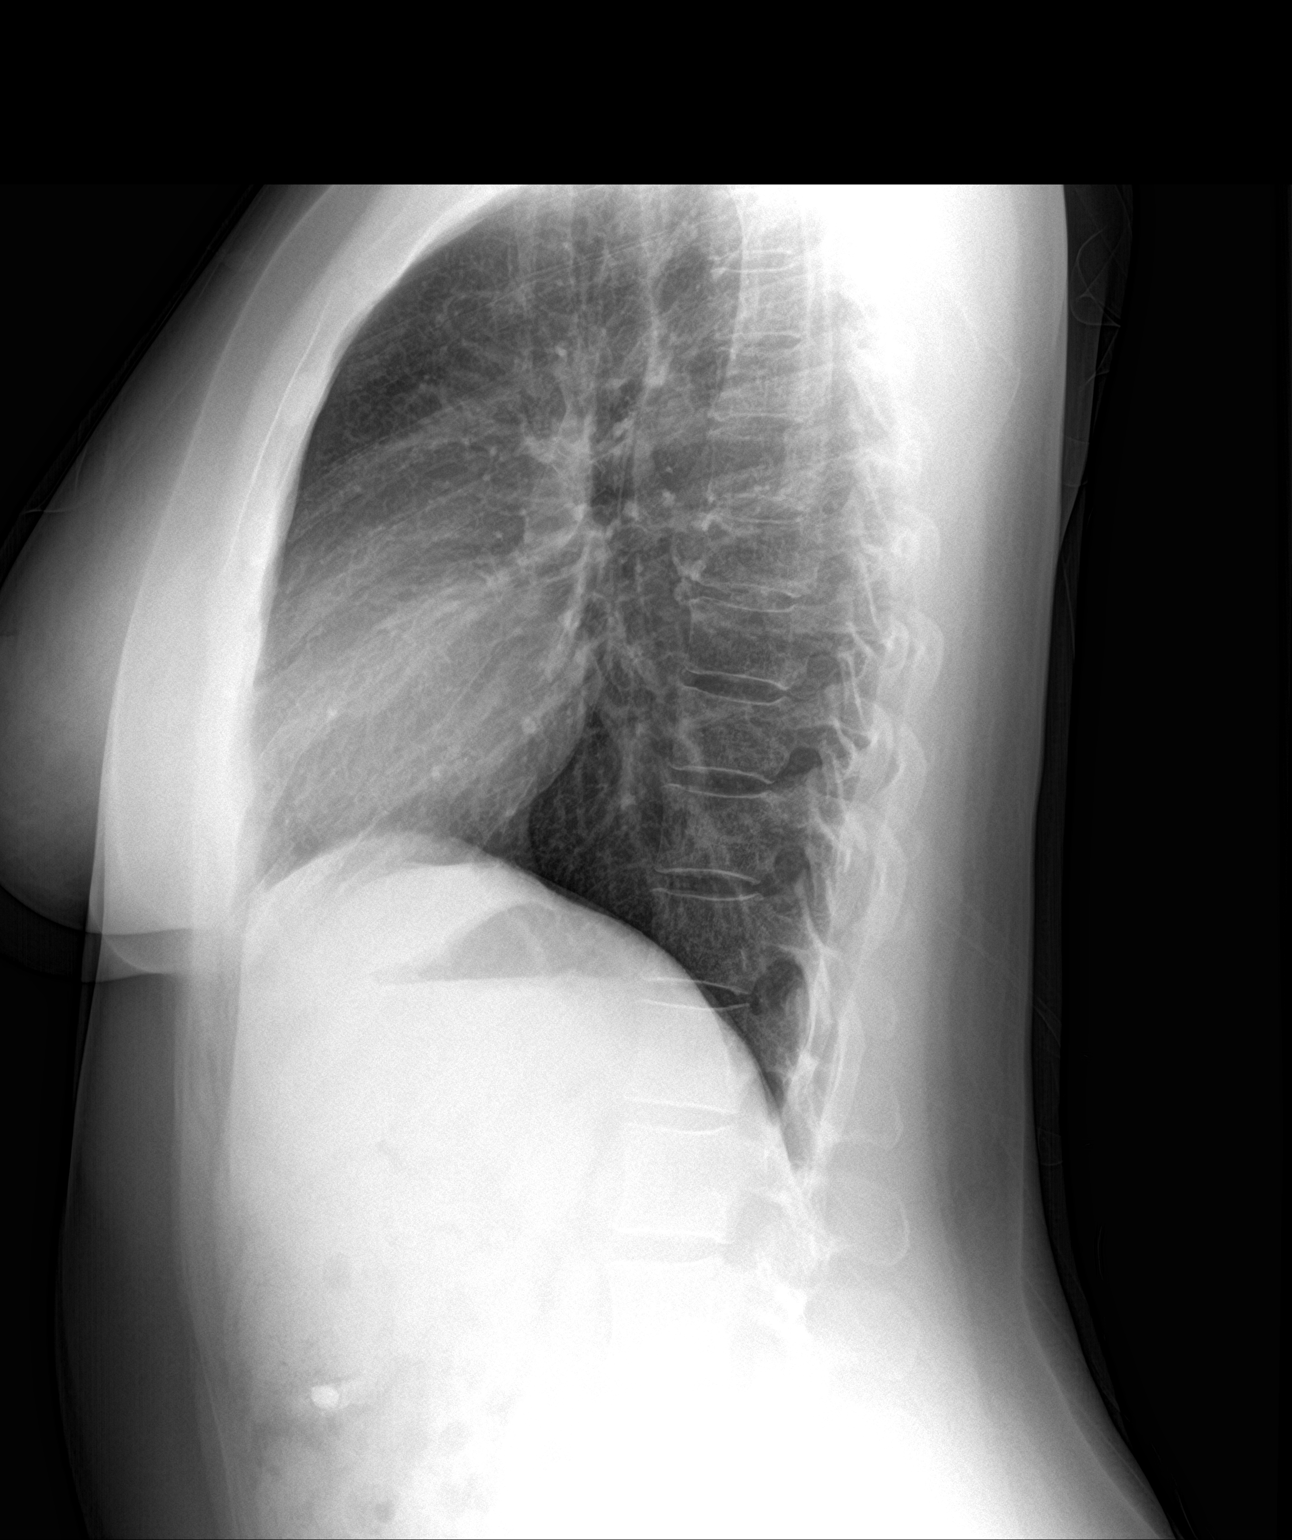

[2 of 2 positions shown; findings below may reference images not displayed]

FINDINGS: The heart size and mediastinal contours are normal. The lungs are
clear. There is no pleural effusion or pneumothorax. No acute
osseous findings are identified. High density is noted anteriorly in
the mid abdomen, possibly ingested material in the stomach.
IMPRESSION: No active cardiopulmonary process.

## 2020-09-18 ENCOUNTER — Other Ambulatory Visit: Payer: Self-pay | Admitting: Family Medicine

## 2020-09-18 DIAGNOSIS — Z1231 Encounter for screening mammogram for malignant neoplasm of breast: Secondary | ICD-10-CM

## 2020-10-22 ENCOUNTER — Ambulatory Visit
Admission: RE | Admit: 2020-10-22 | Discharge: 2020-10-22 | Disposition: A | Discharge status: Home / Self Care | Payer: BC Managed Care – PPO | Source: Ambulatory Visit | Attending: Family Medicine | Admitting: Family Medicine

## 2020-10-22 ENCOUNTER — Other Ambulatory Visit: Payer: Self-pay

## 2020-10-22 DIAGNOSIS — Z1231 Encounter for screening mammogram for malignant neoplasm of breast: Secondary | ICD-10-CM

## 2021-04-01 ENCOUNTER — Other Ambulatory Visit: Payer: Self-pay

## 2021-04-01 ENCOUNTER — Ambulatory Visit: Payer: BC Managed Care – PPO | Admitting: Podiatry

## 2021-04-01 DIAGNOSIS — B351 Tinea unguium: Secondary | ICD-10-CM

## 2021-04-01 DIAGNOSIS — M7752 Other enthesopathy of left foot: Secondary | ICD-10-CM

## 2021-04-01 DIAGNOSIS — M7751 Other enthesopathy of right foot: Secondary | ICD-10-CM | POA: Diagnosis not present

## 2021-04-01 NOTE — Progress Notes (Signed)
° °  HPI: 46 y.o. female presenting today as a reestablish new patient for evaluation of chronic pain to the bilateral forefoot.  Patient had orthotics made in the past which helped significantly.  She would like to have another pair of orthotics made Patient also states that over the last few months she has noticed discoloration with thickening to the right hallux nail plate.  She presents for further treatment and evaluation  Past Medical History:  Diagnosis Date   Rheumatoid arthritis ([place])     Past Surgical History:  Procedure Laterality Date   TOTAL SHOULDER ARTHROPLASTY      No Known Allergies   Physical Exam: General: The patient is alert and oriented x3 in no acute distress.  Dermatology: Skin is warm, dry and supple bilateral lower extremities. Negative for open lesions or macerations.  Discoloration with some dystrophy noted to the right hallux nail plate consistent with possible onychomycosis of the toenail  Vascular: Palpable pedal pulses bilaterally. Capillary refill within normal limits.  Negative for any significant edema or erythema  Neurological: Light touch and protective threshold grossly intact  Musculoskeletal Exam: No pedal deformities noted.  Pain on palpation and range of motion of the bilateral forefoot.  Chronic pain to the plantar ball of the foot   Assessment: 1.  Onychomycosis of toenail right hallux 2.  Chronic metatarsalgia/capsulitis bilateral forefoot   Plan of Care:  1. Patient evaluated.  2. Tolcylen antifungal topical was dispensed at checkout to apply daily 3.  Appointment with Pedorthist for custom molded orthotics 4.  Return to clinic as needed      Edrick Kins, DPM [facility]  Dr. Edrick Kins, DPM    [address], [place] [address]                Office [phone]  Fax [phone]

## 2021-04-05 ENCOUNTER — Other Ambulatory Visit: Payer: Self-pay

## 2021-04-05 ENCOUNTER — Ambulatory Visit (INDEPENDENT_AMBULATORY_CARE_PROVIDER_SITE_OTHER): Payer: BC Managed Care – PPO

## 2021-04-05 DIAGNOSIS — M7751 Other enthesopathy of right foot: Secondary | ICD-10-CM

## 2021-04-05 DIAGNOSIS — M7752 Other enthesopathy of left foot: Secondary | ICD-10-CM

## 2021-04-05 NOTE — Progress Notes (Signed)
SITUATION ?Reason for Consult: Evaluation for Bilateral Custom Foot Orthoses ?Patient / Caregiver Report: Patient is ready for foot orthotics ? ?OBJECTIVE DATA: ?Patient History / Diagnosis:  ?  ICD-10-CM   ?1. Capsulitis of metatarsophalangeal (MTP) joints of both feet  M77.51   ? M77.52   ?  ? ? ?Current or Previous Devices: Historical user ? ?Foot Examination: ?Skin presentation:   Intact ?Ulcers & Callousing:   None and no history ?Toe / Foot Deformities:  Prominent met heads ?Weight Bearing Presentation:  Rectus ?Sensation:    Intact ? ?ORTHOTIC RECOMMENDATION ?Recommended Device: 1x pair of custom functional foot orthotics ? ?GOALS OF ORTHOSES ?- Reduce Pain ?- Prevent Foot Deformity ?- Prevent Progression of Further Foot Deformity ?- Relieve Pressure ?- Improve the Overall Biomechanical Function of the Foot and Lower Extremity. ? ?ACTIONS PERFORMED ?Patient was casted for Foot Orthoses via crush box. Procedure was explained and patient tolerated procedure well. All questions were answered and concerns addressed. ? ?PLAN ?Potential out of pocket cost was communicated to patient. Casts are to be sent to [facility] for fabrication. Patient is to be called for fitting when devices are ready.  ? ? ?

## 2021-04-08 ENCOUNTER — Telehealth: Payer: Self-pay

## 2021-04-08 NOTE — Telephone Encounter (Signed)
Casts sent to central fabrication °

## 2021-05-29 ENCOUNTER — Ambulatory Visit: Payer: BC Managed Care – PPO | Admitting: Podiatry

## 2021-05-29 ENCOUNTER — Ambulatory Visit (INDEPENDENT_AMBULATORY_CARE_PROVIDER_SITE_OTHER): Payer: BC Managed Care – PPO

## 2021-05-29 DIAGNOSIS — G5763 Lesion of plantar nerve, bilateral lower limbs: Secondary | ICD-10-CM

## 2021-05-29 DIAGNOSIS — M7751 Other enthesopathy of right foot: Secondary | ICD-10-CM | POA: Diagnosis not present

## 2021-05-29 DIAGNOSIS — M7752 Other enthesopathy of left foot: Secondary | ICD-10-CM

## 2021-06-10 NOTE — Progress Notes (Signed)
? ?  HPI: 46 y.o. female presenting today for follow-up evaluation of bilateral forefoot pain.  Patient states that she continues to have pain and tenderness and there is not been much improvement since last visit on 04/01/2021. ? ?Past Medical History:  ?Diagnosis Date  ? Rheumatoid arthritis ([place])   ? ? ?Past Surgical History:  ?Procedure Laterality Date  ? TOTAL SHOULDER ARTHROPLASTY    ? ? ?No Known Allergies ?  ?Physical Exam: ?General: The patient is alert and oriented x3 in no acute distress. ? ?Dermatology: Skin is warm, dry and supple bilateral lower extremities. Negative for open lesions or macerations. ? ?Vascular: Palpable pedal pulses bilaterally. Capillary refill within normal limits.  Negative for any significant edema or erythema ? ?Neurological: Light touch and protective threshold grossly intact ? ?Musculoskeletal Exam: No pedal deformities noted.  Patient continues to have some pain and tenderness to the bilateral forefoot. ? ?Assessment: ?1.  Bilateral forefoot pain/metatarsalgia ? ? ?Plan of Care:  ?1. Patient evaluated. X-Rays reviewed.  ?2.  Custom molded orthotics are pending.  Message will be sent to our orthotics department for follow-up.  I do believe orthotics would help support the medial longitudinal arch of the foot and alleviate pressure from the forefoot ?3.  Return to clinic as needed ? ?  ?  ?Edrick Kins, DPM ?[place] ? ?Dr. Edrick Kins, DPM  ?  ?[address]                                        ?[place], [place] [address]                ?Office [phone]  ?Fax [phone] ? ? ? ? ?

## 2021-06-18 ENCOUNTER — Ambulatory Visit: Payer: BC Managed Care – PPO

## 2021-06-18 DIAGNOSIS — M7751 Other enthesopathy of right foot: Secondary | ICD-10-CM

## 2021-06-18 DIAGNOSIS — G5763 Lesion of plantar nerve, bilateral lower limbs: Secondary | ICD-10-CM

## 2021-06-18 NOTE — Progress Notes (Signed)
SITUATION: ?Reason for Visit: Fitting and Delivery of Custom Fabricated Foot Orthoses ?Patient Report: Patient reports comfort and is satisfied with device. ? ?OBJECTIVE DATA: ?Patient History / Diagnosis:   ?  ICD-10-CM   ?1. [place] metatarsalgia, bilateral  G57.63   ?  ?2. Capsulitis of metatarsophalangeal (MTP) joints of both feet  M77.51   ? M77.52   ?  ? ? ?Provided Device:  Custom Functional Foot Orthotics ?    [facility]: [id number] ? ?GOAL OF ORTHOSIS ?- Improve gait ?- Decrease energy expenditure ?- Improve Balance ?- Provide Triplanar stability of foot complex ?- Facilitate motion ? ?ACTIONS PERFORMED ?Patient was fit with foot orthotics trimmed to shoe last. Patient tolerated fittign procedure.  ? ?Patient was provided with verbal and written instruction and demonstration regarding donning, doffing, wear, care, proper fit, function, purpose, cleaning, and use of the orthosis and in all related precautions and risks and benefits regarding the orthosis. ? ?Patient was also provided with verbal instruction regarding how to report any failures or malfunctions of the orthosis and necessary follow up care. Patient was also instructed to contact our office regarding any change in status that may affect the function of the orthosis. ? ?Patient demonstrated independence with proper donning, doffing, and fit and verbalized understanding of all instructions. ? ?PLAN: ?Patient is to follow up in one week or as necessary (PRN). All questions were answered and concerns addressed. Plan of care was discussed with and agreed upon by the patient. ? ?

## 2021-08-15 ENCOUNTER — Ambulatory Visit (INDEPENDENT_AMBULATORY_CARE_PROVIDER_SITE_OTHER): Payer: BC Managed Care – PPO

## 2021-08-15 DIAGNOSIS — G5763 Lesion of plantar nerve, bilateral lower limbs: Secondary | ICD-10-CM

## 2021-08-15 DIAGNOSIS — M7752 Other enthesopathy of left foot: Secondary | ICD-10-CM

## 2021-08-15 DIAGNOSIS — M7751 Other enthesopathy of right foot: Secondary | ICD-10-CM

## 2021-08-16 NOTE — Progress Notes (Signed)
Patient presented to office for an adjustment in her orthotics. Patient stated that she was expecting to have metatarsal pads in her inserts but they were not built with them. Patient stated that she would like this to be added. Orthotics to be sent to [facility] for adjustments. Advised patient that she would receive a call back when they come in.

## 2021-09-18 ENCOUNTER — Ambulatory Visit (INDEPENDENT_AMBULATORY_CARE_PROVIDER_SITE_OTHER): Payer: BC Managed Care – PPO

## 2021-09-18 DIAGNOSIS — G5763 Lesion of plantar nerve, bilateral lower limbs: Secondary | ICD-10-CM

## 2021-09-18 NOTE — Progress Notes (Signed)
Patient presents today to pick up custom molded foot orthotics, diagnosed with [place] metatarsalgia by Dr. Amalia Hailey.   Orthotics were dispensed and fit was satisfactory. Reviewed instructions for break-in and wear. Written instructions given to patient.  Patient will follow up as needed.   [facility] - order # [id number]

## 2021-11-20 ENCOUNTER — Ambulatory Visit (INDEPENDENT_AMBULATORY_CARE_PROVIDER_SITE_OTHER): Payer: BC Managed Care – PPO

## 2021-11-20 DIAGNOSIS — G5763 Lesion of plantar nerve, bilateral lower limbs: Secondary | ICD-10-CM

## 2021-11-20 NOTE — Progress Notes (Signed)
Patient in office for orthotic adjustments.   Patient states her met pads are not in the right place and causing discomfort. Orthotics will be sent back to [facility] to make the appropriate adjustments.   Patient will be contacted when orthotics are ready to be picked-up.

## 2021-12-05 ENCOUNTER — Ambulatory Visit (INDEPENDENT_AMBULATORY_CARE_PROVIDER_SITE_OTHER): Payer: BC Managed Care – PPO

## 2021-12-05 DIAGNOSIS — G5763 Lesion of plantar nerve, bilateral lower limbs: Secondary | ICD-10-CM

## 2021-12-05 NOTE — Progress Notes (Signed)
Patient presents today to pick up custom molded foot orthotics, diagnosed with [place] Metatarsalgia by Dr. Amalia Hailey.   Orthotics were dispensed and fit was satisfactory. Reviewed instructions for break-in and wear. Written instructions given to patient.  Patient will follow up as needed.   [facility] - order # [id number]

## 2022-08-01 IMAGING — MG MM DIGITAL SCREENING BILAT W/ TOMO AND CAD
8 series · 9 of 24 positions shown · non-contrast
Comparison: Previous exam(s).

CLINICAL DATA: Screening.

EXAM:
DIGITAL SCREENING BILATERAL MAMMOGRAM WITH TOMOSYNTHESIS AND CAD
TECHNIQUE: Bilateral screening digital craniocaudal and mediolateral oblique
mammograms were obtained. Bilateral screening digital breast
tomosynthesis was performed. The images were evaluated with
computer-aided detection.

[L MLO synth-2D]
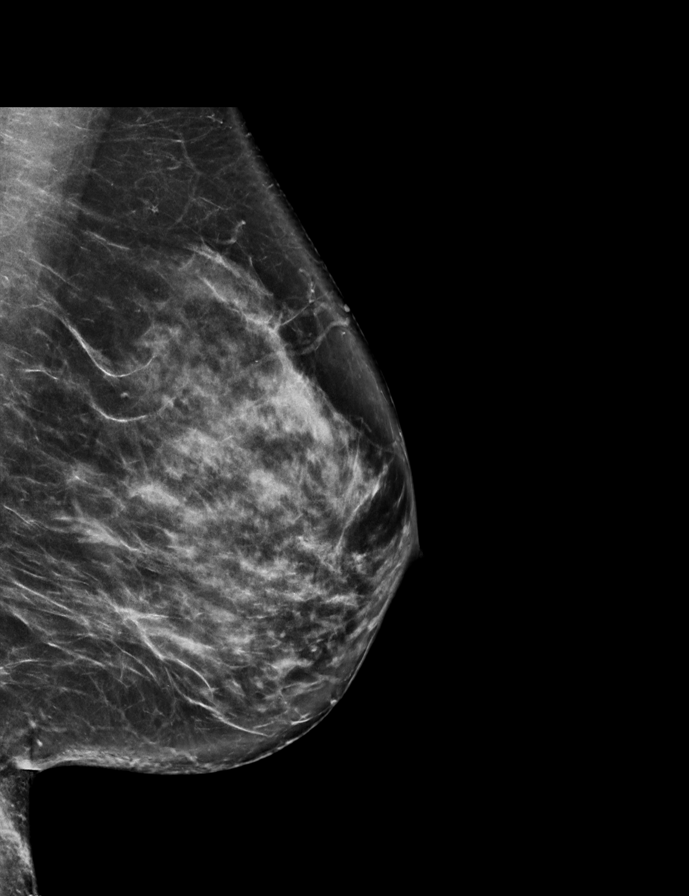

[R MLO synth-2D]
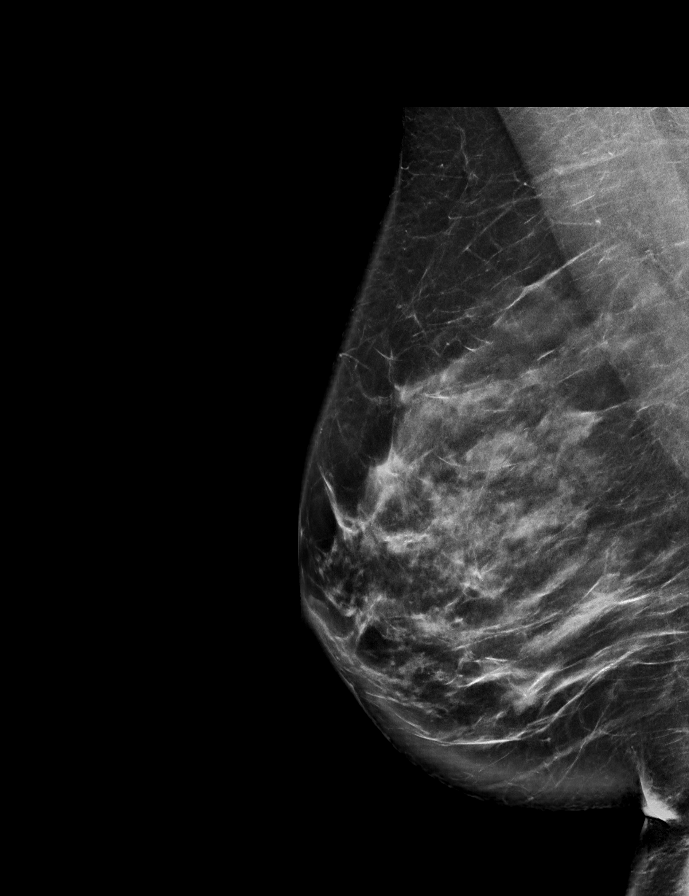

[L CC synth-2D]
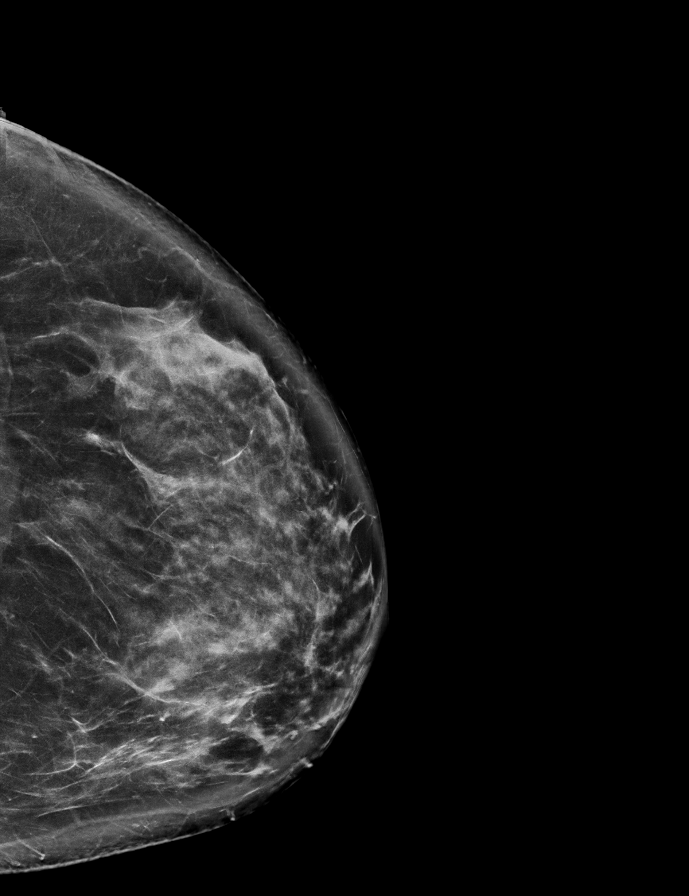

[R CC synth-2D]
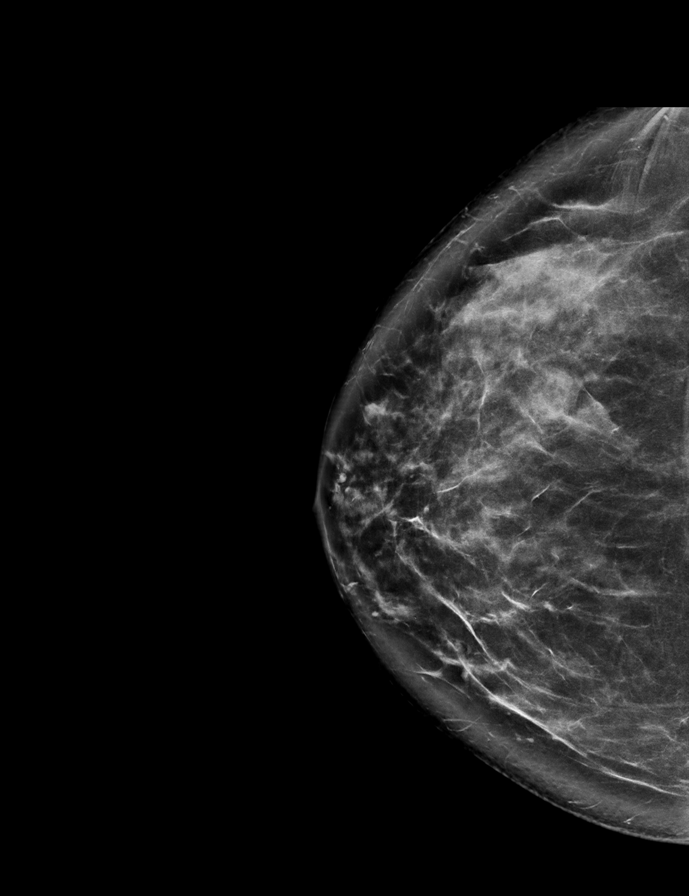

[R MLO tomo · 2 of 87 frames shown]
[frame 29/87]
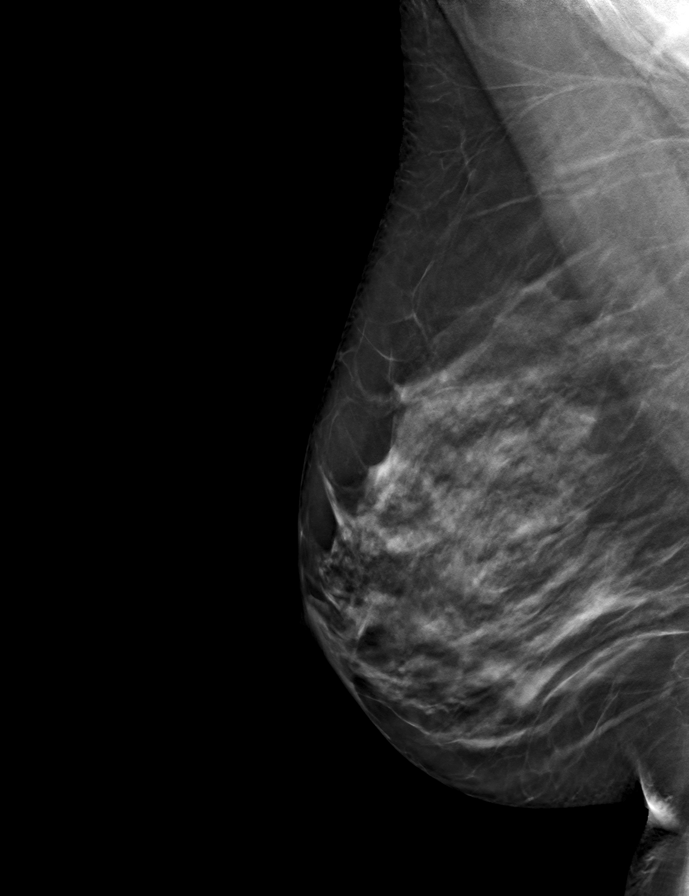
[frame 44/87]
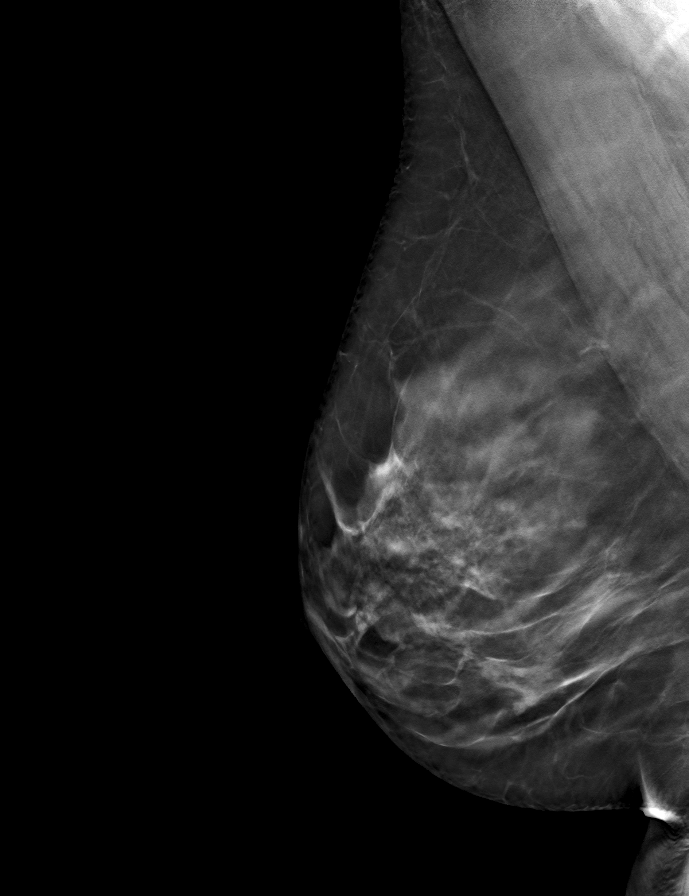

[L MLO tomo · tomo slice 41/80.0]
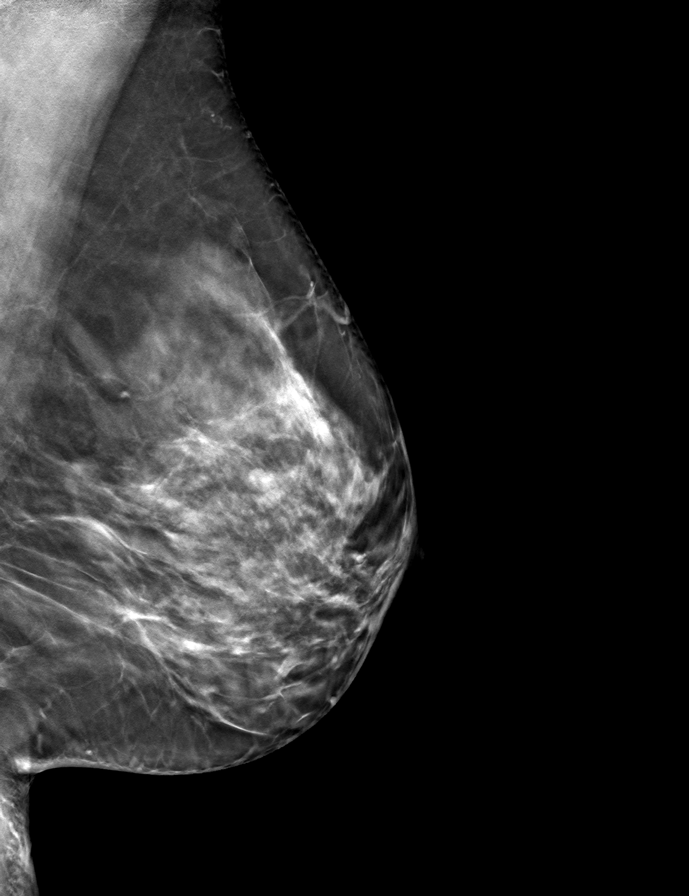

[L CC tomo · tomo slice 43/84.0]
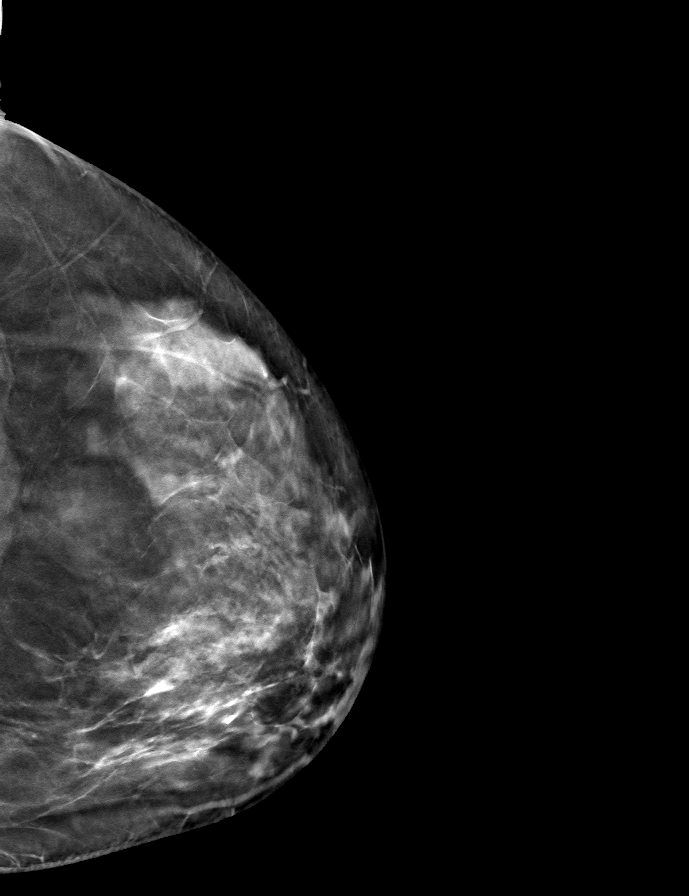

[R CC tomo · tomo slice 43/86.0]
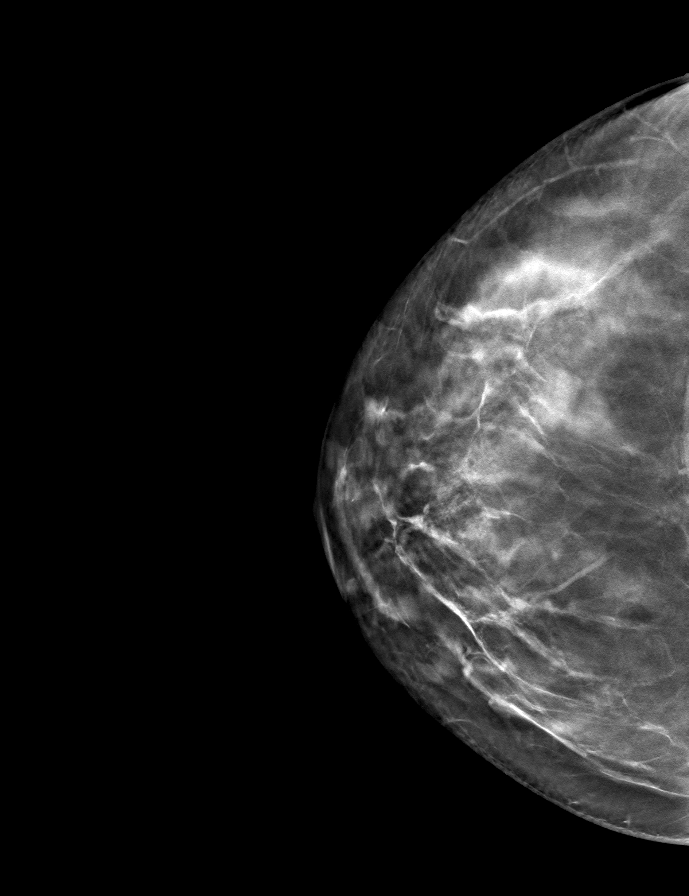

[9 of 24 positions shown; findings below may reference images not displayed]

ACR Breast Density Category c: The breast tissue is heterogeneously
dense, which may obscure small masses.
FINDINGS: There are no findings suspicious for malignancy.
IMPRESSION: No mammographic evidence of malignancy. A result letter of this
screening mammogram will be mailed directly to the patient.

RECOMMENDATION:
Screening mammogram in one year. (Code:Q3-W-BC3)

BI-RADS CATEGORY  1: Negative.

## 2023-04-14 ENCOUNTER — Encounter: Payer: Self-pay | Admitting: Family Medicine

## 2023-04-14 DIAGNOSIS — N63 Unspecified lump in unspecified breast: Secondary | ICD-10-CM

## 2023-04-15 ENCOUNTER — Encounter: Payer: Self-pay | Admitting: Family Medicine

## 2023-04-17 ENCOUNTER — Other Ambulatory Visit: Payer: Self-pay | Admitting: Family Medicine

## 2023-04-17 DIAGNOSIS — N6320 Unspecified lump in the left breast, unspecified quadrant: Secondary | ICD-10-CM

## 2023-05-19 ENCOUNTER — Ambulatory Visit
Admission: RE | Admit: 2023-05-19 | Discharge: 2023-05-19 | Disposition: A | Discharge status: Home / Self Care | Payer: Self-pay | Source: Ambulatory Visit | Attending: Family Medicine | Admitting: Family Medicine

## 2023-05-19 DIAGNOSIS — N6320 Unspecified lump in the left breast, unspecified quadrant: Secondary | ICD-10-CM

## 2024-04-12 ENCOUNTER — Ambulatory Visit
